# Patient Record
Sex: Female | Born: 1985 | Race: White | Hispanic: No | Marital: Single
Health system: Southern US, Community
[De-identification: ages and names within clinical notes are randomized; demographics above are authoritative.]

## PROBLEM LIST (undated history)

## (undated) DIAGNOSIS — Z87442 Personal history of urinary calculi: Secondary | ICD-10-CM

## (undated) DIAGNOSIS — R51 Headache: Secondary | ICD-10-CM

## (undated) DIAGNOSIS — G43909 Migraine, unspecified, not intractable, without status migrainosus: Secondary | ICD-10-CM

## (undated) DIAGNOSIS — Z9289 Personal history of other medical treatment: Secondary | ICD-10-CM

## (undated) DIAGNOSIS — R519 Headache, unspecified: Secondary | ICD-10-CM

## (undated) DIAGNOSIS — T7840XA Allergy, unspecified, initial encounter: Secondary | ICD-10-CM

## (undated) HISTORY — PX: FRACTURE SURGERY: SHX138

## (undated) HISTORY — PX: TYMPANOSTOMY TUBE PLACEMENT: SHX32

## (undated) HISTORY — DX: Allergy, unspecified, initial encounter: T78.40XA

---

## 2018-03-20 ENCOUNTER — Telehealth: Payer: Self-pay | Admitting: Family Medicine

## 2018-03-20 NOTE — Telephone Encounter (Signed)
Called pt to try and reschedule her appt with Dr. Clelia CroftShaw on 03/29/18. Dr. Clelia CroftShaw is on leave for September and October. When pt calls back, she may make an appt with another provider or make an appt with Dr. Clelia CroftShaw in November.  Thank you!

## 2018-03-25 ENCOUNTER — Telehealth: Payer: Self-pay | Admitting: Family Medicine

## 2018-03-25 NOTE — Telephone Encounter (Signed)
Pt. Called requesting script for spironolactone 50mg  tablets 3 times daily and estradiol 2mg  tablets 90mg  1 by mouth 3 times daily. Pt. Is transgender and had, had an appt to see Dr. Clelia Croft in October that was canceled. Pt. Has been advised that as they have not been seen before a refill was unlikely.   If filled please send to [facility] on [address]

## 2018-03-29 ENCOUNTER — Encounter

## 2018-03-29 ENCOUNTER — Ambulatory Visit: Payer: Self-pay | Admitting: Family Medicine

## 2018-04-17 ENCOUNTER — Ambulatory Visit (INDEPENDENT_AMBULATORY_CARE_PROVIDER_SITE_OTHER): Payer: Self-pay | Admitting: Family Medicine

## 2018-04-17 ENCOUNTER — Encounter: Payer: Self-pay | Admitting: Family Medicine

## 2018-04-17 ENCOUNTER — Other Ambulatory Visit: Payer: Self-pay

## 2018-04-17 VITALS — BP 128/82 | HR 95 | Temp 97.7°F | Ht 64.0 in | Wt 222.4 lb

## 2018-04-17 DIAGNOSIS — F640 Transsexualism: Secondary | ICD-10-CM

## 2018-04-17 DIAGNOSIS — Z789 Other specified health status: Secondary | ICD-10-CM

## 2018-04-17 DIAGNOSIS — F902 Attention-deficit hyperactivity disorder, combined type: Secondary | ICD-10-CM

## 2018-04-17 DIAGNOSIS — Z79899 Other long term (current) drug therapy: Secondary | ICD-10-CM

## 2018-04-17 MED ORDER — AMPHETAMINE-DEXTROAMPHET ER 25 MG PO CP24: 25.0000 mg | ORAL_CAPSULE | ORAL | 0 refills | Status: DC

## 2018-04-17 MED ORDER — ESTRADIOL 2 MG PO TABS: 2.0000 mg | ORAL_TABLET | Freq: Three times a day (TID) | ORAL | 2 refills | Status: AC

## 2018-04-17 MED ORDER — SPIRONOLACTONE 100 MG PO TABS: 100.0000 mg | ORAL_TABLET | Freq: Three times a day (TID) | ORAL | 2 refills | Status: AC

## 2018-04-17 NOTE — Progress Notes (Signed)
10/21/20191:38 PM  Amy Hampton 1986/05/16, 32 y.o. female [id number]  Chief Complaint  Patient presents with  . Medication Management    refills on adderall 25mg , estradial and spirolactone    HPI:   Patient is a 32 y.o. female with past medical history significant for ADHD and M> F who presents today to establish care  Previous patient of Dr Ruby Cola, last appt in April 2019 Started hormone therapy since 2013 Has had some minor interruptions during that time due to finances Last adjustment was to spironolactone, decreased from TTD 300mg  to 150mg , unclear why, she denies any side effects at higher dose she has noticed increase facial hair She cant afford laser hair removal at this time Estradiol has been stable on TTD 6mg  She is seeing a Systems analyst for exercise Goes about 2-3 x week, would like to increase to 4 x week Going back to school, sociology with minor in criminal behavior No counseling In a relationship with transgender M>F Does not smoke Rare alcohol No other drug use  She has been on current meds since 8th grade Dr Ruby Cola was also managing ADHD Notices significant difference when not on medication such as following meetings, power point presentations., completing tasks Has been without adderral for several months pmp reviewed  ASRS evaluation: Part A 1. How often do you have trouble wrapping up the final details of a project, once the challenging parts have been done?: (!) Very Often 2. How often do you have difficulty getting things done in order when you have to do a task that requires organization?: (!) Very Often 3. How often do you have problems remembering appointments or obligations?: (!) Very Often 4. When you have a task that requires a lot of thought, how often do you avoid or delay getting started?: (!) Very Often 5. How often do you fidget or squirm with your hands or feet when you have to sit down for a long time?: (!) Often 6. How often  do you feel overly active and compelled to do things, like you were driven by a motor?: (!) Often Part B 7. How often do you make careless mistakes when you have to work on a boring or difficult project?: (!) Very Often 8. How often do you have difficulty keeping your attention when you are doing boring or repetitive work?: Sometimes 9. How often do you have difficulty concentrating on what people say to you, even when they are speaking to you directly?: (!) Often 10. How often do you misplace or have difficulty finding things at home or at work?: (!) Very Often 11. How often are you distracted by activity or noise around you?: (!) Very Often 12. How often do you leave your seat in meetings or other situations in which you are expected to remain seated?: (!) Very Often 13. How often do you feel restless or fidgety?: (!) Often 14. How often do you have difficulty unwinding and relaxing when you have time to yourself?: (!) Often 15. How often do you find yourself talking too much when you are in social situations?: (!) Often 16. When you are in a conversation, how often do you find yourself finishing the sentences of the people you are talking to, before they can finish them themselves?: (!) Often 17. How often do you have difficulty waiting your turn in situations when turn taking is required?: (!) Often 18. How often do you interrupt others when they are busy?: (!) Often   Fall Risk  04/17/2018  Falls in the past year? No     Depression screen PHQ 2/9 04/17/2018  Decreased Interest 0  Down, Depressed, Hopeless 0  PHQ - 2 Score 0    Allergies  Allergen Reactions  . Zocor [Simvastatin]     Prior to Admission medications   Medication Sig Start Date End Date Taking? Authorizing Provider  amphetamine-dextroamphetamine (ADDERALL XR) 25 MG 24 hr capsule Take 25 mg by mouth every morning.   Yes [provider]  estradiol (ESTRACE) 2 MG tablet Take 2 mg by mouth 3 (three) times  daily.   Yes [provider]  spironolactone (ALDACTONE) 50 MG tablet Take 50 mg by mouth 3 (three) times daily.   Yes [provider]    No past medical history on file.  History reviewed. No pertinent surgical history.  Social History   Tobacco Use  . Smoking status: Never Smoker  . Smokeless tobacco: Never Used  Substance Use Topics  . Alcohol use: Yes    Family History  Adopted: Yes    ROS Per hpi  OBJECTIVE:  Blood pressure 128/82, pulse 95, temperature 97.7 F (36.5 C), temperature source Oral, height 5\' 4"  (1.626 m), weight 222 lb 6.4 oz (100.9 kg), SpO2 94 %. Body mass index is 38.17 kg/m.   Physical Exam  Constitutional: She is oriented to person, place, and time. She appears well-developed and well-nourished.  HENT:  Head: Normocephalic and atraumatic.  Mouth/Throat: Mucous membranes are normal.  Eyes: Pupils are equal, round, and reactive to light. Conjunctivae and EOM are normal. No scleral icterus.  Neck: Neck supple.  Pulmonary/Chest: Effort normal.  Neurological: She is alert and oriented to person, place, and time.  Skin: Skin is warm and dry.  Psychiatric: She has a normal mood and affect.  Nursing note and vitals reviewed.   ASSESSMENT and PLAN  1. Female-to-female transgender person Establishing care today. Reviewed wpath r/se/b of feminizing hormone therapy. Signed documents scanned into her chart. Having increase facial hair, she would like to see if increased dose of spironolactone will help. Discussed slow titration. Discussed r/se/b. Repeat labs and BP in about 3 weeks  2. Medication management - Testosterone, Free, Total, SHBG - Estradiol - CBC - Comprehensive metabolic panel - Basic metabolic panel; Future  3. Attention deficit hyperactivity disorder (ADHD), combined type Uncontrolled in setting of being off meds. pmp reviewed. Medication refilled. She is to call in for refills.  Other orders -  amphetamine-dextroamphetamine (ADDERALL XR) 25 MG 24 hr capsule; Take 1 capsule by mouth every morning. - estradiol (ESTRACE) 2 MG tablet; Take 1 tablet (2 mg total) by mouth 3 (three) times daily. - spironolactone (ALDACTONE) 100 MG tablet; Take 1 tablet (100 mg total) by mouth 3 (three) times daily.  Return in about 3 months (around 07/18/2018) for Dr Clelia Croft.    Myles Lipps, MD Primary Care at [facility] [address] [place], [place] [address] Ph.  [phone] Fax [phone]

## 2018-04-17 NOTE — Patient Instructions (Addendum)
Slowly increase spironolactone by 50mg  a day every week  Once at target dose of 300mg  daily, come in for labs and BP check   If you have lab work done today you will be contacted with your lab results within the next 2 weeks.  If you have not heard from [place] then please contact us. The fastest way to get your results is to register for My Chart.   IF you received an x-ray today, you will receive an invoice from [facility]. Please contact [facility] at [phone] with questions or concerns regarding your invoice.   IF you received labwork today, you will receive an invoice from [facility]. Please contact [facility] at [phone] with questions or concerns regarding your invoice.   Our billing staff will not be able to assist you with questions regarding bills from these companies.  You will be contacted with the lab results as soon as they are available. The fastest way to get your results is to activate your My Chart account. Instructions are located on the last page of this paperwork. If you have not heard from [place] regarding the results in 2 weeks, please contact this office.

## 2018-04-18 LAB — TESTOSTERONE, FREE, TOTAL, SHBG
Sex Hormone Binding: 30 nmol/L (ref 24.6–122.0)
Testosterone, Free: 18.5 pg/mL — ABNORMAL HIGH (ref 0.0–4.2)
Testosterone: 574 ng/dL — ABNORMAL HIGH (ref 8–48)

## 2018-04-18 LAB — CBC
Hematocrit: 44.8 % (ref 34.0–46.6)
Hemoglobin: 15.8 g/dL (ref 11.1–15.9)
MCH: 30.9 pg (ref 26.6–33.0)
MCHC: 35.3 g/dL (ref 31.5–35.7)
MCV: 88 fL (ref 79–97)
Platelets: 318 10*3/uL (ref 150–450)
RBC: 5.12 x10E6/uL (ref 3.77–5.28)
RDW: 12.8 % (ref 12.3–15.4)
WBC: 7.4 10*3/uL (ref 3.4–10.8)

## 2018-04-18 LAB — COMPREHENSIVE METABOLIC PANEL
ALT: 77 IU/L — ABNORMAL HIGH (ref 0–32)
AST: 35 IU/L (ref 0–40)
Albumin/Globulin Ratio: 1.9 (ref 1.2–2.2)
Albumin: 5 g/dL (ref 3.5–5.5)
Alkaline Phosphatase: 75 IU/L (ref 39–117)
BUN/Creatinine Ratio: 16 (ref 9–23)
BUN: 13 mg/dL (ref 6–20)
Bilirubin Total: 0.4 mg/dL (ref 0.0–1.2)
CO2: 19 mmol/L — ABNORMAL LOW (ref 20–29)
Calcium: 9.4 mg/dL (ref 8.7–10.2)
Chloride: 103 mmol/L (ref 96–106)
Creatinine, Ser: 0.82 mg/dL (ref 0.57–1.00)
GFR calc Af Amer: 112 mL/min/{1.73_m2} (ref >59)
GFR calc non Af Amer: 97 mL/min/{1.73_m2} (ref >59)
Globulin, Total: 2.7 g/dL (ref 1.5–4.5)
Glucose: 81 mg/dL (ref 65–99)
Potassium: 4.3 mmol/L (ref 3.5–5.2)
Sodium: 144 mmol/L (ref 134–144)
Total Protein: 7.7 g/dL (ref 6.0–8.5)

## 2018-04-18 LAB — ESTRADIOL: Estradiol: 58.2 pg/mL

## 2018-04-21 ENCOUNTER — Encounter: Payer: Self-pay | Admitting: *Deleted

## 2018-04-21 ENCOUNTER — Telehealth: Payer: Self-pay

## 2018-04-21 NOTE — Telephone Encounter (Signed)
Pt c/b re lab results.  Reviewed provider's feedback.  Will recheck at follow up appt.  Pt verbalizes understanding and denies any questions at this time.

## 2018-04-28 DIAGNOSIS — Z9289 Personal history of other medical treatment: Secondary | ICD-10-CM

## 2018-04-28 HISTORY — DX: Personal history of other medical treatment: Z92.89

## 2018-05-12 ENCOUNTER — Telehealth: Payer: Self-pay | Admitting: Family Medicine

## 2018-05-12 NOTE — Telephone Encounter (Signed)
Copied from CRM [phone]#[id number]. Topic: Quick Communication - Rx Refill/Question >> May 12, 2018 12:10 PM Jens SomMedley, Jennifer A wrote: Medication: amphetamine-dextroamphetamine (ADDERALL XR) 25 MG 24 hr capsule [[id number]][[id number]]  Has the patient contacted their pharmacy? Yes  (Agent: If no, request that the patient contact the pharmacy for the refill.) (Agent: If yes, when and what did the pharmacy advise?)  Preferred Pharmacy (with phone number or street name):[id number] Ginette Otto- [place], [place] - [address] AT [facility] [address] [facility] [address] [place] [id number] Phone: [phone] Fax: [phone]    Agent: Please be advised that RX refills may take up to 3 business days. We ask that you follow-up with your pharmacy.

## 2018-05-17 ENCOUNTER — Emergency Department (HOSPITAL_COMMUNITY): Payer: Self-pay

## 2018-05-17 ENCOUNTER — Inpatient Hospital Stay (HOSPITAL_COMMUNITY): Payer: Self-pay

## 2018-05-17 ENCOUNTER — Encounter (HOSPITAL_COMMUNITY): Admission: EM | Disposition: A | Discharge status: Skilled Nursing Facility | Payer: Self-pay | Source: Home / Self Care

## 2018-05-17 ENCOUNTER — Encounter (HOSPITAL_COMMUNITY): Payer: Self-pay | Admitting: Orthopedic Surgery

## 2018-05-17 ENCOUNTER — Emergency Department (HOSPITAL_COMMUNITY): Payer: Self-pay | Admitting: Anesthesiology

## 2018-05-17 ENCOUNTER — Inpatient Hospital Stay (HOSPITAL_COMMUNITY)
Admission: EM | Admit: 2018-05-17 | Discharge: 2018-06-05 | DRG: 516 | Disposition: A | Discharge status: Skilled Nursing Facility | Payer: Self-pay | Attending: Student | Admitting: Student

## 2018-05-17 DIAGNOSIS — E8889 Other specified metabolic disorders: Secondary | ICD-10-CM | POA: Diagnosis present

## 2018-05-17 DIAGNOSIS — S32810B Multiple fractures of pelvis with stable disruption of pelvic ring, initial encounter for open fracture: Secondary | ICD-10-CM

## 2018-05-17 DIAGNOSIS — D62 Acute posthemorrhagic anemia: Secondary | ICD-10-CM

## 2018-05-17 DIAGNOSIS — E559 Vitamin D deficiency, unspecified: Secondary | ICD-10-CM | POA: Diagnosis present

## 2018-05-17 DIAGNOSIS — S3210XA Unspecified fracture of sacrum, initial encounter for closed fracture: Secondary | ICD-10-CM | POA: Diagnosis present

## 2018-05-17 DIAGNOSIS — R52 Pain, unspecified: Secondary | ICD-10-CM

## 2018-05-17 DIAGNOSIS — N9489 Other specified conditions associated with female genital organs and menstrual cycle: Secondary | ICD-10-CM | POA: Diagnosis present

## 2018-05-17 DIAGNOSIS — G8918 Other acute postprocedural pain: Secondary | ICD-10-CM

## 2018-05-17 DIAGNOSIS — Z789 Other specified health status: Secondary | ICD-10-CM

## 2018-05-17 DIAGNOSIS — S52615A Nondisplaced fracture of left ulna styloid process, initial encounter for closed fracture: Secondary | ICD-10-CM | POA: Diagnosis present

## 2018-05-17 DIAGNOSIS — Z888 Allergy status to other drugs, medicaments and biological substances status: Secondary | ICD-10-CM

## 2018-05-17 DIAGNOSIS — T1490XA Injury, unspecified, initial encounter: Secondary | ICD-10-CM

## 2018-05-17 DIAGNOSIS — F649 Gender identity disorder, unspecified: Secondary | ICD-10-CM | POA: Diagnosis present

## 2018-05-17 DIAGNOSIS — S52612A Displaced fracture of left ulna styloid process, initial encounter for closed fracture: Secondary | ICD-10-CM

## 2018-05-17 DIAGNOSIS — Z419 Encounter for procedure for purposes other than remedying health state, unspecified: Secondary | ICD-10-CM

## 2018-05-17 DIAGNOSIS — M25539 Pain in unspecified wrist: Secondary | ICD-10-CM

## 2018-05-17 DIAGNOSIS — R0682 Tachypnea, not elsewhere classified: Secondary | ICD-10-CM

## 2018-05-17 DIAGNOSIS — S52512A Displaced fracture of left radial styloid process, initial encounter for closed fracture: Secondary | ICD-10-CM | POA: Diagnosis present

## 2018-05-17 DIAGNOSIS — S32810A Multiple fractures of pelvis with stable disruption of pelvic ring, initial encounter for closed fracture: Principal | ICD-10-CM

## 2018-05-17 DIAGNOSIS — R000 Tachycardia, unspecified: Secondary | ICD-10-CM

## 2018-05-17 DIAGNOSIS — F640 Transsexualism: Secondary | ICD-10-CM

## 2018-05-17 DIAGNOSIS — E876 Hypokalemia: Secondary | ICD-10-CM

## 2018-05-17 DIAGNOSIS — S52611A Displaced fracture of right ulna styloid process, initial encounter for closed fracture: Secondary | ICD-10-CM | POA: Diagnosis present

## 2018-05-17 DIAGNOSIS — S329XXA Fracture of unspecified parts of lumbosacral spine and pelvis, initial encounter for closed fracture: Secondary | ICD-10-CM | POA: Diagnosis present

## 2018-05-17 HISTORY — DX: Personal history of other medical treatment: Z92.89

## 2018-05-17 HISTORY — DX: Headache: R51

## 2018-05-17 HISTORY — DX: Rider (driver) (passenger) of other motorcycle injured in unspecified traffic accident, initial encounter: V29.99XA

## 2018-05-17 HISTORY — PX: EXTERNAL FIXATION PELVIS: SHX1551

## 2018-05-17 HISTORY — DX: Migraine, unspecified, not intractable, without status migrainosus: G43.909

## 2018-05-17 HISTORY — DX: Personal history of urinary calculi: Z87.442

## 2018-05-17 HISTORY — PX: ORIF PELVIC FRACTURE: SHX2128

## 2018-05-17 HISTORY — DX: Headache, unspecified: R51.9

## 2018-05-17 LAB — CBC WITH DIFFERENTIAL/PLATELET
Abs Immature Granulocytes: 0.14 10*3/uL — ABNORMAL HIGH (ref 0.00–0.07)
Basophils Absolute: 0.1 10*3/uL (ref 0.0–0.1)
Basophils Relative: 0 %
EOS PCT: 1 %
Eosinophils Absolute: 0.1 10*3/uL (ref 0.0–0.5)
HEMATOCRIT: 40.6 % (ref 36.0–46.0)
HEMOGLOBIN: 13.8 g/dL (ref 12.0–15.0)
Immature Granulocytes: 1 %
LYMPHS ABS: 3.5 10*3/uL (ref 0.7–4.0)
LYMPHS PCT: 23 %
MCH: 30.2 pg (ref 26.0–34.0)
MCHC: 34 g/dL (ref 30.0–36.0)
MCV: 88.8 fL (ref 80.0–100.0)
MONO ABS: 1.1 10*3/uL — AB (ref 0.1–1.0)
Monocytes Relative: 7 %
Neutro Abs: 10.5 10*3/uL — ABNORMAL HIGH (ref 1.7–7.7)
Neutrophils Relative %: 68 %
Platelets: 449 10*3/uL — ABNORMAL HIGH (ref 150–400)
RBC: 4.57 MIL/uL (ref 3.87–5.11)
RDW: 11.7 % (ref 11.5–15.5)
WBC: 15.5 10*3/uL — ABNORMAL HIGH (ref 4.0–10.5)
nRBC: 0 % (ref 0.0–0.2)

## 2018-05-17 LAB — COMPREHENSIVE METABOLIC PANEL
ALBUMIN: 3.8 g/dL (ref 3.5–5.0)
ALK PHOS: 52 U/L (ref 38–126)
ALT: 25 U/L (ref 0–44)
AST: 22 U/L (ref 15–41)
Anion gap: 8 (ref 5–15)
BUN: 19 mg/dL (ref 6–20)
CALCIUM: 8.8 mg/dL — AB (ref 8.9–10.3)
CO2: 22 mmol/L (ref 22–32)
CREATININE: 1.02 mg/dL — AB (ref 0.44–1.00)
Chloride: 109 mmol/L (ref 98–111)
GFR calc Af Amer: >60 mL/min (ref >60)
GFR calc non Af Amer: >60 mL/min (ref >60)
GLUCOSE: 169 mg/dL — AB (ref 70–99)
Potassium: 3.8 mmol/L (ref 3.5–5.1)
SODIUM: 139 mmol/L (ref 135–145)
Total Bilirubin: 0.7 mg/dL (ref 0.3–1.2)
Total Protein: 6.9 g/dL (ref 6.5–8.1)

## 2018-05-17 LAB — POCT I-STAT 4, (NA,K, GLUC, HGB,HCT)
Glucose, Bld: 151 mg/dL — ABNORMAL HIGH (ref 70–99)
HCT: 39 % (ref 36.0–46.0)
HEMOGLOBIN: 13.3 g/dL (ref 12.0–15.0)
POTASSIUM: 4.3 mmol/L (ref 3.5–5.1)
Sodium: 141 mmol/L (ref 135–145)

## 2018-05-17 LAB — I-STAT CHEM 8, ED
BUN: 21 mg/dL — ABNORMAL HIGH (ref 6–20)
Calcium, Ion: 1.11 mmol/L — ABNORMAL LOW (ref 1.15–1.40)
Chloride: 106 mmol/L (ref 98–111)
Creatinine, Ser: 0.9 mg/dL (ref 0.44–1.00)
GLUCOSE: 167 mg/dL — AB (ref 70–99)
HCT: 39 % (ref 36.0–46.0)
HEMOGLOBIN: 13.3 g/dL (ref 12.0–15.0)
POTASSIUM: 3.8 mmol/L (ref 3.5–5.1)
Sodium: 139 mmol/L (ref 135–145)
TCO2: 25 mmol/L (ref 22–32)

## 2018-05-17 LAB — CDS SEROLOGY

## 2018-05-17 LAB — I-STAT CG4 LACTIC ACID, ED: LACTIC ACID, VENOUS: 2.71 mmol/L — AB (ref 0.5–1.9)

## 2018-05-17 LAB — ETHANOL: Alcohol, Ethyl (B): <10 mg/dL (ref <10)

## 2018-05-17 LAB — MRSA PCR SCREENING: MRSA by PCR: NEGATIVE

## 2018-05-17 LAB — ABO/RH: ABO/RH(D): A NEG

## 2018-05-17 LAB — PREPARE RBC (CROSSMATCH)

## 2018-05-17 SURGERY — EXTERNAL FIXATION, PELVIS
Anesthesia: General

## 2018-05-17 MED ORDER — SUCCINYLCHOLINE CHLORIDE 200 MG/10ML IV SOSY
PREFILLED_SYRINGE | INTRAVENOUS | Status: DC | PRN
  Administered 2018-05-17: 120 mg via INTRAVENOUS

## 2018-05-17 MED ORDER — HYDROMORPHONE HCL 1 MG/ML IJ SOLN
0.5000 mg | Freq: Once | INTRAMUSCULAR | Status: AC
  Administered 2018-05-17: 0.5 mg via INTRAVENOUS
  Filled 2018-05-17: qty 0.5

## 2018-05-17 MED ORDER — SODIUM CHLORIDE 0.9% IV SOLUTION: Freq: Once | INTRAVENOUS | Status: DC

## 2018-05-17 MED ORDER — PANTOPRAZOLE SODIUM 40 MG PO TBEC
40.0000 mg | DELAYED_RELEASE_TABLET | Freq: Every day | ORAL | Status: DC
  Administered 2018-05-18 – 2018-06-05 (×18): 40 mg via ORAL
  Filled 2018-05-17 (×18): qty 1

## 2018-05-17 MED ORDER — ALBUMIN HUMAN 5 % IV SOLN
25.0000 g | Freq: Once | INTRAVENOUS | Status: AC
  Administered 2018-05-17: 25 g via INTRAVENOUS
  Filled 2018-05-17 (×2): qty 500

## 2018-05-17 MED ORDER — LACTATED RINGERS IV SOLN
INTRAVENOUS | Status: DC | PRN
  Administered 2018-05-17 (×2): via INTRAVENOUS

## 2018-05-17 MED ORDER — DIPHENHYDRAMINE HCL 12.5 MG/5ML PO ELIX: 12.5000 mg | ORAL_SOLUTION | Freq: Four times a day (QID) | ORAL | Status: DC | PRN

## 2018-05-17 MED ORDER — KETOROLAC TROMETHAMINE 15 MG/ML IJ SOLN
15.0000 mg | Freq: Four times a day (QID) | INTRAMUSCULAR | Status: AC
  Administered 2018-05-17 – 2018-05-18 (×4): 15 mg via INTRAVENOUS
  Filled 2018-05-17 (×5): qty 1

## 2018-05-17 MED ORDER — PANTOPRAZOLE SODIUM 40 MG IV SOLR: 40.0000 mg | Freq: Every day | INTRAVENOUS | Status: DC

## 2018-05-17 MED ORDER — HYDROMORPHONE HCL 1 MG/ML IJ SOLN
0.2500 mg | INTRAMUSCULAR | Status: DC | PRN
  Administered 2018-05-17 (×4): 0.5 mg via INTRAVENOUS

## 2018-05-17 MED ORDER — LIDOCAINE 2% (20 MG/ML) 5 ML SYRINGE
INTRAMUSCULAR | Status: AC
  Filled 2018-05-17: qty 5

## 2018-05-17 MED ORDER — DEXAMETHASONE SODIUM PHOSPHATE 10 MG/ML IJ SOLN
INTRAMUSCULAR | Status: DC | PRN
  Administered 2018-05-17: 10 mg via INTRAVENOUS

## 2018-05-17 MED ORDER — MIDAZOLAM HCL 2 MG/2ML IJ SOLN
INTRAMUSCULAR | Status: DC | PRN
  Administered 2018-05-17: 2 mg via INTRAVENOUS

## 2018-05-17 MED ORDER — HYDROMORPHONE HCL 1 MG/ML IJ SOLN: 0.5000 mg | INTRAMUSCULAR | Status: DC | PRN

## 2018-05-17 MED ORDER — GABAPENTIN 100 MG PO CAPS
100.0000 mg | ORAL_CAPSULE | Freq: Three times a day (TID) | ORAL | Status: DC
  Administered 2018-05-17 – 2018-05-24 (×19): 100 mg via ORAL
  Filled 2018-05-17 (×19): qty 1

## 2018-05-17 MED ORDER — FENTANYL CITRATE (PF) 100 MCG/2ML IJ SOLN
INTRAMUSCULAR | Status: DC | PRN
  Administered 2018-05-17 (×2): 50 ug via INTRAVENOUS

## 2018-05-17 MED ORDER — NALOXONE HCL 0.4 MG/ML IJ SOLN: 0.4000 mg | INTRAMUSCULAR | Status: DC | PRN

## 2018-05-17 MED ORDER — 0.9 % SODIUM CHLORIDE (POUR BTL) OPTIME
TOPICAL | Status: DC | PRN
  Administered 2018-05-17: 1000 mL

## 2018-05-17 MED ORDER — ROCURONIUM BROMIDE 50 MG/5ML IV SOSY
PREFILLED_SYRINGE | INTRAVENOUS | Status: AC
  Filled 2018-05-17: qty 5

## 2018-05-17 MED ORDER — PHENYLEPHRINE 40 MCG/ML (10ML) SYRINGE FOR IV PUSH (FOR BLOOD PRESSURE SUPPORT)
PREFILLED_SYRINGE | INTRAVENOUS | Status: AC
  Filled 2018-05-17: qty 10

## 2018-05-17 MED ORDER — POVIDONE-IODINE 10 % EX SWAB: 2.0000 "application " | Freq: Once | CUTANEOUS | Status: DC

## 2018-05-17 MED ORDER — MIDAZOLAM HCL 2 MG/2ML IJ SOLN
2.0000 mg | Freq: Once | INTRAMUSCULAR | Status: AC
  Administered 2018-05-17: 2 mg via INTRAVENOUS

## 2018-05-17 MED ORDER — PHENYLEPHRINE 40 MCG/ML (10ML) SYRINGE FOR IV PUSH (FOR BLOOD PRESSURE SUPPORT)
PREFILLED_SYRINGE | INTRAVENOUS | Status: DC | PRN
  Administered 2018-05-17 (×4): 80 ug via INTRAVENOUS

## 2018-05-17 MED ORDER — FENTANYL CITRATE (PF) 250 MCG/5ML IJ SOLN
INTRAMUSCULAR | Status: AC
  Filled 2018-05-17: qty 5

## 2018-05-17 MED ORDER — ACETAMINOPHEN 500 MG PO TABS
1000.0000 mg | ORAL_TABLET | Freq: Three times a day (TID) | ORAL | Status: DC
  Administered 2018-05-17 – 2018-05-20 (×8): 1000 mg via ORAL
  Filled 2018-05-17 (×8): qty 2

## 2018-05-17 MED ORDER — ONDANSETRON HCL 4 MG/2ML IJ SOLN
4.0000 mg | Freq: Once | INTRAMUSCULAR | Status: AC
  Administered 2018-05-17: 4 mg via INTRAVENOUS
  Filled 2018-05-17: qty 2

## 2018-05-17 MED ORDER — CLINDAMYCIN PHOSPHATE 900 MG/50ML IV SOLN
INTRAVENOUS | Status: AC
  Filled 2018-05-17: qty 50

## 2018-05-17 MED ORDER — MIDAZOLAM HCL 2 MG/2ML IJ SOLN
INTRAMUSCULAR | Status: AC
  Administered 2018-05-17: 2 mg via INTRAVENOUS
  Filled 2018-05-17: qty 2

## 2018-05-17 MED ORDER — OXYCODONE HCL 5 MG PO TABS
5.0000 mg | ORAL_TABLET | ORAL | Status: DC | PRN
  Administered 2018-05-17 – 2018-05-21 (×8): 10 mg via ORAL
  Filled 2018-05-17 (×10): qty 2

## 2018-05-17 MED ORDER — HYDROMORPHONE HCL 1 MG/ML IJ SOLN
INTRAMUSCULAR | Status: AC
  Administered 2018-05-17: 0.5 mg via INTRAVENOUS
  Filled 2018-05-17: qty 2

## 2018-05-17 MED ORDER — IOHEXOL 300 MG/ML  SOLN
100.0000 mL | Freq: Once | INTRAMUSCULAR | Status: AC | PRN
  Administered 2018-05-17: 100 mL via INTRAVENOUS

## 2018-05-17 MED ORDER — FENTANYL CITRATE (PF) 100 MCG/2ML IJ SOLN
INTRAMUSCULAR | Status: AC
  Administered 2018-05-17: 50 ug via INTRAVENOUS
  Filled 2018-05-17: qty 2

## 2018-05-17 MED ORDER — FENTANYL CITRATE (PF) 100 MCG/2ML IJ SOLN
INTRAMUSCULAR | Status: AC
  Filled 2018-05-17: qty 2

## 2018-05-17 MED ORDER — HYDROMORPHONE HCL 1 MG/ML IJ SOLN: 0.5000 mg | Freq: Once | INTRAMUSCULAR | Status: DC

## 2018-05-17 MED ORDER — MIDAZOLAM HCL 2 MG/2ML IJ SOLN
INTRAMUSCULAR | Status: AC
  Filled 2018-05-17: qty 2

## 2018-05-17 MED ORDER — ENOXAPARIN SODIUM 40 MG/0.4ML ~~LOC~~ SOLN
40.0000 mg | SUBCUTANEOUS | Status: DC
  Administered 2018-05-17 – 2018-06-04 (×19): 40 mg via SUBCUTANEOUS
  Filled 2018-05-17 (×19): qty 0.4

## 2018-05-17 MED ORDER — CLINDAMYCIN PHOSPHATE 900 MG/50ML IV SOLN
900.0000 mg | INTRAVENOUS | Status: AC
  Administered 2018-05-17: 900 mg via INTRAVENOUS
  Filled 2018-05-17: qty 50

## 2018-05-17 MED ORDER — PROPOFOL 10 MG/ML IV BOLUS
INTRAVENOUS | Status: DC | PRN
  Administered 2018-05-17: 100 mg via INTRAVENOUS

## 2018-05-17 MED ORDER — LIDOCAINE 2% (20 MG/ML) 5 ML SYRINGE
INTRAMUSCULAR | Status: DC | PRN
  Administered 2018-05-17: 60 mg via INTRAVENOUS

## 2018-05-17 MED ORDER — ROCURONIUM BROMIDE 50 MG/5ML IV SOSY
PREFILLED_SYRINGE | INTRAVENOUS | Status: DC | PRN
  Administered 2018-05-17: 30 mg via INTRAVENOUS
  Administered 2018-05-17: 20 mg via INTRAVENOUS

## 2018-05-17 MED ORDER — HYDROMORPHONE HCL 1 MG/ML IJ SOLN
INTRAMUSCULAR | Status: AC
  Administered 2018-05-17: 0.5 mg via INTRAVENOUS
  Filled 2018-05-17: qty 1

## 2018-05-17 MED ORDER — MORPHINE SULFATE (PF) 4 MG/ML IV SOLN
4.0000 mg | Freq: Once | INTRAVENOUS | Status: AC
  Administered 2018-05-17: 4 mg via INTRAVENOUS
  Filled 2018-05-17: qty 1

## 2018-05-17 MED ORDER — SODIUM CHLORIDE 0.9 % IV SOLN
INTRAVENOUS | Status: DC
  Administered 2018-05-17 – 2018-05-20 (×2): via INTRAVENOUS

## 2018-05-17 MED ORDER — ORAL CARE MOUTH RINSE
15.0000 mL | Freq: Two times a day (BID) | OROMUCOSAL | Status: DC
  Administered 2018-05-18 – 2018-05-31 (×18): 15 mL via OROMUCOSAL

## 2018-05-17 MED ORDER — HYDROMORPHONE HCL 1 MG/ML IJ SOLN
0.5000 mg | Freq: Once | INTRAMUSCULAR | Status: AC
  Administered 2018-05-17: 0.5 mg via INTRAVENOUS

## 2018-05-17 MED ORDER — CHLORHEXIDINE GLUCONATE 4 % EX LIQD
60.0000 mL | Freq: Once | CUTANEOUS | Status: DC
  Filled 2018-05-17: qty 60

## 2018-05-17 MED ORDER — SODIUM CHLORIDE 0.9 % IV SOLN
INTRAVENOUS | Status: DC | PRN
  Administered 2018-05-17: 30 ug/min via INTRAVENOUS

## 2018-05-17 MED ORDER — HYDROMORPHONE HCL 1 MG/ML IJ SOLN: 1.0000 mg | INTRAMUSCULAR | Status: DC | PRN

## 2018-05-17 MED ORDER — SUCCINYLCHOLINE CHLORIDE 200 MG/10ML IV SOSY
PREFILLED_SYRINGE | INTRAVENOUS | Status: AC
  Filled 2018-05-17: qty 10

## 2018-05-17 MED ORDER — DEXAMETHASONE SODIUM PHOSPHATE 10 MG/ML IJ SOLN
INTRAMUSCULAR | Status: AC
  Filled 2018-05-17: qty 1

## 2018-05-17 MED ORDER — DIPHENHYDRAMINE HCL 50 MG/ML IJ SOLN
12.5000 mg | Freq: Four times a day (QID) | INTRAMUSCULAR | Status: DC | PRN
  Administered 2018-05-18 – 2018-05-21 (×2): 12.5 mg via INTRAVENOUS
  Filled 2018-05-17 (×2): qty 1

## 2018-05-17 MED ORDER — ONDANSETRON HCL 4 MG/2ML IJ SOLN
INTRAMUSCULAR | Status: AC
  Filled 2018-05-17: qty 2

## 2018-05-17 MED ORDER — SODIUM CHLORIDE 0.9% FLUSH: 9.0000 mL | INTRAVENOUS | Status: DC | PRN

## 2018-05-17 MED ORDER — FENTANYL CITRATE (PF) 100 MCG/2ML IJ SOLN
50.0000 ug | Freq: Once | INTRAMUSCULAR | Status: AC
  Administered 2018-05-17: 50 ug via INTRAVENOUS

## 2018-05-17 MED ORDER — ONDANSETRON 4 MG PO TBDP
4.0000 mg | ORAL_TABLET | Freq: Four times a day (QID) | ORAL | Status: DC | PRN
  Administered 2018-05-30: 4 mg via ORAL
  Filled 2018-05-17: qty 1

## 2018-05-17 MED ORDER — HYDROMORPHONE 1 MG/ML IV SOLN
INTRAVENOUS | Status: DC
  Administered 2018-05-17: 2.1 mg via INTRAVENOUS
  Administered 2018-05-17: 25 mg via INTRAVENOUS
  Administered 2018-05-18: 2.6 mg via INTRAVENOUS
  Administered 2018-05-18: 3.8 mg via INTRAVENOUS
  Administered 2018-05-18: 2.2 mg via INTRAVENOUS
  Administered 2018-05-18: 1.6 mg via INTRAVENOUS
  Administered 2018-05-19: 25 mg via INTRAVENOUS
  Filled 2018-05-17 (×2): qty 25

## 2018-05-17 MED ORDER — ONDANSETRON HCL 4 MG/2ML IJ SOLN
4.0000 mg | Freq: Four times a day (QID) | INTRAMUSCULAR | Status: DC | PRN
  Filled 2018-05-17 (×2): qty 2

## 2018-05-17 MED ORDER — LACTATED RINGERS IV SOLN
INTRAVENOUS | Status: DC
  Administered 2018-05-17 – 2018-05-21 (×3): via INTRAVENOUS

## 2018-05-17 MED ORDER — SUGAMMADEX SODIUM 200 MG/2ML IV SOLN
INTRAVENOUS | Status: DC | PRN
  Administered 2018-05-17: 200 mg via INTRAVENOUS

## 2018-05-17 MED ORDER — ONDANSETRON HCL 4 MG/2ML IJ SOLN
4.0000 mg | Freq: Four times a day (QID) | INTRAMUSCULAR | Status: DC | PRN
  Administered 2018-05-23 – 2018-05-25 (×2): 4 mg via INTRAVENOUS

## 2018-05-17 MED ORDER — ONDANSETRON HCL 4 MG/2ML IJ SOLN
INTRAMUSCULAR | Status: DC | PRN
  Administered 2018-05-17: 4 mg via INTRAVENOUS

## 2018-05-17 MED ORDER — SUGAMMADEX SODIUM 500 MG/5ML IV SOLN
INTRAVENOUS | Status: AC
  Filled 2018-05-17: qty 5

## 2018-05-17 SURGICAL SUPPLY — 60 items
BENZOIN TINCTURE PRP APPL 2/3 (GAUZE/BANDAGES/DRESSINGS) ×9 IMPLANT
BLADE CLIPPER SURG (BLADE) IMPLANT
BLADE SURG 11 STRL SS (BLADE) ×3 IMPLANT
BNDG GAUZE ELAST 4 BULKY (GAUZE/BANDAGES/DRESSINGS) ×3 IMPLANT
BRUSH SCRUB SURG 4.25 DISP (MISCELLANEOUS) ×6 IMPLANT
CHLORAPREP W/TINT 26ML (MISCELLANEOUS) ×3 IMPLANT
CLAMP ROD ATTACHMENT (Clamp) ×9 IMPLANT
COVER SURGICAL LIGHT HANDLE (MISCELLANEOUS) ×6 IMPLANT
COVER WAND RF STERILE (DRAPES) ×3 IMPLANT
DERMABOND ADVANCED (GAUZE/BANDAGES/DRESSINGS) ×4
DERMABOND ADVANCED .7 DNX12 (GAUZE/BANDAGES/DRESSINGS) ×2 IMPLANT
DRAIN CHANNEL 15F RND FF W/TCR (WOUND CARE) IMPLANT
DRAPE C-ARM 42X72 X-RAY (DRAPES) ×3 IMPLANT
DRAPE C-ARMOR (DRAPES) ×3 IMPLANT
DRAPE INCISE IOBAN 66X45 STRL (DRAPES) ×6 IMPLANT
DRAPE INCISE IOBAN 85X60 (DRAPES) ×3 IMPLANT
DRAPE PROXIMA HALF (DRAPES) ×6 IMPLANT
DRAPE SURG 17X23 STRL (DRAPES) ×18 IMPLANT
DRAPE U-SHAPE 47X51 STRL (DRAPES) ×3 IMPLANT
DRAPE UNIVERSAL PACK (DRAPES) ×3 IMPLANT
DRSG MEPILEX BORDER 4X4 (GAUZE/BANDAGES/DRESSINGS) ×6 IMPLANT
DRSG MEPILEX BORDER 4X8 (GAUZE/BANDAGES/DRESSINGS) ×6 IMPLANT
ELECT REM PT RETURN 9FT ADLT (ELECTROSURGICAL) ×3
ELECTRODE REM PT RTRN 9FT ADLT (ELECTROSURGICAL) ×1 IMPLANT
EVACUATOR SILICONE 100CC (DRAIN) IMPLANT
GLOVE BIO SURGEON STRL SZ7.5 (GLOVE) ×12 IMPLANT
GLOVE BIOGEL PI IND STRL 7.5 (GLOVE) ×1 IMPLANT
GLOVE BIOGEL PI INDICATOR 7.5 (GLOVE) ×2
GOWN STRL REUS W/ TWL LRG LVL3 (GOWN DISPOSABLE) ×2 IMPLANT
GOWN STRL REUS W/TWL LRG LVL3 (GOWN DISPOSABLE) ×4
KIT BASIN OR (CUSTOM PROCEDURE TRAY) ×3 IMPLANT
KIT TURNOVER KIT B (KITS) ×3 IMPLANT
MANIFOLD NEPTUNE II (INSTRUMENTS) ×3 IMPLANT
NS IRRIG 1000ML POUR BTL (IV SOLUTION) ×6 IMPLANT
PACK GENERAL/GYN (CUSTOM PROCEDURE TRAY) ×3 IMPLANT
PACK TOTAL JOINT (CUSTOM PROCEDURE TRAY) ×3 IMPLANT
PACK UNIVERSAL I (CUSTOM PROCEDURE TRAY) ×3 IMPLANT
PAD ARMBOARD 7.5X6 YLW CONV (MISCELLANEOUS) ×6 IMPLANT
ROD CRBN FBR LRG EX-FX 11X250 (Rod) ×3 IMPLANT
SCREW SCHNZ SD 5.0 80 THRD/200 (Screw) ×2 IMPLANT
SCREW SHANZ 4.0X80MM (EXFIX) ×6 IMPLANT
SCRW SCHANZ SD 5.0 80 THRD/200 (Screw) ×6 IMPLANT
SPONGE LAP 18X18 X RAY DECT (DISPOSABLE) IMPLANT
STAPLER VISISTAT 35W (STAPLE) ×3 IMPLANT
SUCTION FRAZIER HANDLE 10FR (MISCELLANEOUS) ×2
SUCTION TUBE FRAZIER 10FR DISP (MISCELLANEOUS) ×1 IMPLANT
SUT MNCRL AB 3-0 PS2 18 (SUTURE) ×3 IMPLANT
SUT MON AB 2-0 CT1 36 (SUTURE) ×3 IMPLANT
SUT VIC AB 0 CT1 27 (SUTURE) ×4
SUT VIC AB 0 CT1 27XBRD ANBCTR (SUTURE) ×2 IMPLANT
SUT VIC AB 1 CT1 18XCR BRD 8 (SUTURE) IMPLANT
SUT VIC AB 1 CT1 8-18 (SUTURE)
SUT VIC AB 2-0 CT1 27 (SUTURE) ×4
SUT VIC AB 2-0 CT1 TAPERPNT 27 (SUTURE) ×2 IMPLANT
SUT VIC AB 2-0 FS1 27 (SUTURE) ×3 IMPLANT
TOWEL OR 17X24 6PK STRL BLUE (TOWEL DISPOSABLE) ×3 IMPLANT
TOWEL OR 17X26 10 PK STRL BLUE (TOWEL DISPOSABLE) ×6 IMPLANT
TRAY FOLEY MTR SLVR 16FR STAT (SET/KITS/TRAYS/PACK) IMPLANT
UNDERPAD 30X30 (UNDERPADS AND DIAPERS) ×3 IMPLANT
WATER STERILE IRR 1000ML POUR (IV SOLUTION) ×12 IMPLANT

## 2018-05-17 NOTE — Progress Notes (Signed)
Received request for anesthesia for second IV site.  When I touched patients right wrist, she withdrew in pain. Patient stated right wrist pain is ongoing.  Called Dr. Jena Hampton in OR for right wrist xray order.  Xray performed at bedside.
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
## 2018-05-17 NOTE — Transfer of Care (Signed)
Immediate Anesthesia Transfer of Care Note  Patient: Amy Lodgeiffany Hampton  Procedure(s) Performed: EXTERNAL FIXATION PELVIS (N/A ) OPEN REDUCTION INTERNAL FIXATION (ORIF) PELVIC FRACTURE (N/A )  Patient Location: PACU  Anesthesia Type:General  Level of Consciousness: awake and patient cooperative  Airway & Oxygen Therapy: Patient Spontanous Breathing and Patient connected to nasal cannula oxygen  Post-op Assessment: Report given to RN, Post -op Vital signs reviewed and stable and Patient moving all extremities X 4  Post vital signs: Reviewed and stable  Last Vitals:  Vitals Value Taken Time  BP 120/62 05/17/2018  5:04 PM  Temp    Pulse 107 05/17/2018  5:12 PM  Resp 14 05/17/2018  5:12 PM  SpO2 99 % 05/17/2018  5:12 PM  Vitals shown include unvalidated device data.  Last Pain:  Vitals:   05/17/18 0819  TempSrc: Temporal         Complications: No apparent anesthesia complications

## 2018-05-17 NOTE — Anesthesia Preprocedure Evaluation (Addendum)
Anesthesia Evaluation  Patient identified by MRN, date of birth, ID band Patient awake    Reviewed: Allergy & Precautions, NPO status , Patient's Chart, lab work & pertinent test results  History of Anesthesia Complications Negative for: history of anesthetic complications  Airway Mallampati: III  TM Distance: >3 FB Neck ROM: Full    Dental no notable dental hx. (+) Dental Advisory Given   Pulmonary neg pulmonary ROS,    Pulmonary exam normal        Cardiovascular negative cardio ROS Normal cardiovascular exam     Neuro/Psych negative neurological ROS     GI/Hepatic negative GI ROS, Neg liver ROS,   Endo/Other  negative endocrine ROS  Renal/GU negative Renal ROS     Musculoskeletal negative musculoskeletal ROS (+)   Abdominal   Peds  Hematology negative hematology ROS (+)   Anesthesia Other Findings Day of surgery medications reviewed with the patient.  Reproductive/Obstetrics                            Anesthesia Physical Anesthesia Plan  ASA: III  Anesthesia Plan: General   Post-op Pain Management:    Induction: Intravenous  PONV Risk Score and Plan: 3 and Ondansetron, Dexamethasone and Diphenhydramine  Airway Management Planned: Oral ETT  Additional Equipment:   Intra-op Plan:   Post-operative Plan: Extubation in OR  Informed Consent: I have reviewed the patients History and Physical, chart, labs and discussed the procedure including the risks, benefits and alternatives for the proposed anesthesia with the patient or authorized representative who has indicated his/her understanding and acceptance.   Dental advisory given  Plan Discussed with: CRNA and Anesthesiologist  Anesthesia Plan Comments:        Anesthesia Quick Evaluation
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
## 2018-05-17 NOTE — Progress Notes (Signed)
Patient is tachycardic and has some abdominal pain without evidence of intra-abdominal injury on CT.  Does have a significant pelvic hematoma.  C-spine is cleared  Marta LamasJames O. Gae BonWyatt, III, MD, FACS [phone] Trauma Surgeon

## 2018-05-17 NOTE — Progress Notes (Addendum)
Patient's heart rate 140- 160.  Dr. Krista BlueSinger made aware and we will continue to monitor.

## 2018-05-17 NOTE — Progress Notes (Signed)
Orthopedic Tech Progress Note Patient Details:  Amy Hampton 12/14/1985 [id number]  Ortho Devices Type of Ortho Device: Ace wrap, Sling arm elevator Ortho Device/Splint Interventions: Application   Post Interventions Patient Tolerated: Well Instructions Provided: Care of device   Saul FordyceJennifer C Yeni Jiggetts 05/17/2018, 10:47 AM

## 2018-05-17 NOTE — Anesthesia Postprocedure Evaluation (Signed)
Anesthesia Post Note  Patient: Dartha Lodgeiffany Ramakrishnan  Procedure(s) Performed: EXTERNAL FIXATION PELVIS (N/A ) OPEN REDUCTION INTERNAL FIXATION (ORIF) PELVIC FRACTURE (N/A )     Patient location during evaluation: PACU Anesthesia Type: General Level of consciousness: awake and alert Pain management: pain level controlled Vital Signs Assessment: post-procedure vital signs reviewed and stable Respiratory status: spontaneous breathing, nonlabored ventilation, respiratory function stable and patient connected to nasal cannula oxygen Cardiovascular status: blood pressure returned to baseline and stable Postop Assessment: no apparent nausea or vomiting Anesthetic complications: no    Last Vitals:  Vitals:   05/17/18 1705 05/17/18 1735  BP: 120/62 123/71  Pulse: 98 92  Resp: 18 16  Temp: (!) 36.2 C   SpO2: 100% 97%    Last Pain:  Vitals:   05/17/18 1745  TempSrc:   PainSc: 10-Worst pain ever                 Yordin Rhoda,W. EDMOND
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
## 2018-05-17 NOTE — Anesthesia Procedure Notes (Signed)
Central Venous Catheter Insertion Performed by: Heather RobertsSinger, Margo Lama, MD, anesthesiologist Start/End11/20/2019 1:36 PM, 05/17/2018 1:46 PM Patient location: Pre-op. Preanesthetic checklist: patient identified, IV checked, site marked, risks and benefits discussed, surgical consent, monitors and equipment checked, pre-op evaluation, timeout performed and anesthesia consent Position: Trendelenburg Lidocaine 1% used for infiltration and patient sedated Hand hygiene performed , maximum sterile barriers used  and Seldinger technique used Catheter size: 8 Fr Total catheter length 16. Central line was placed.Double lumen Procedure performed using ultrasound guided technique. Ultrasound Notes:image(s) printed for medical record Attempts: 1 Following insertion, dressing applied, line sutured and Biopatch. Post procedure assessment: blood return through all ports, free fluid flow and no air  Patient tolerated the procedure well with no immediate complications.
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
## 2018-05-17 NOTE — ED Notes (Signed)
Immediately after pelvic xray pelvic binder applied. Ems had already wrapped pelvis

## 2018-05-17 NOTE — Anesthesia Procedure Notes (Signed)
Procedure Name: Intubation Date/Time: 05/17/2018 3:29 PM Performed by: Pearson Grippeobertson, Raymie Trani M, CRNA Pre-anesthesia Checklist: Patient identified, Emergency Drugs available, Suction available and Patient being monitored Patient Re-evaluated:Patient Re-evaluated prior to induction Oxygen Delivery Method: Circle system utilized Preoxygenation: Pre-oxygenation with 100% oxygen Induction Type: IV induction Ventilation: Mask ventilation without difficulty and Oral airway inserted - appropriate to patient size Laryngoscope Size: Hyacinth MeekerMiller and 2 Grade View: Grade II Tube type: Oral Tube size: 7.0 mm Number of attempts: 1 Airway Equipment and Method: Stylet and Oral airway Placement Confirmation: ETT inserted through vocal cords under direct vision,  positive ETCO2 and breath sounds checked- equal and bilateral Secured at: 22 cm Tube secured with: Tape Dental Injury: Teeth and Oropharynx as per pre-operative assessment

## 2018-05-18 LAB — BASIC METABOLIC PANEL
Anion gap: 5 (ref 5–15)
BUN: 10 mg/dL (ref 6–20)
CO2: 25 mmol/L (ref 22–32)
CREATININE: 0.78 mg/dL (ref 0.44–1.00)
Calcium: 7.8 mg/dL — ABNORMAL LOW (ref 8.9–10.3)
Chloride: 105 mmol/L (ref 98–111)
GFR calc Af Amer: >60 mL/min (ref >60)
GFR calc non Af Amer: >60 mL/min (ref >60)
Glucose, Bld: 135 mg/dL — ABNORMAL HIGH (ref 70–99)
Potassium: 3.5 mmol/L (ref 3.5–5.1)
SODIUM: 135 mmol/L (ref 135–145)

## 2018-05-18 LAB — CBC
HCT: 26.8 % — ABNORMAL LOW (ref 36.0–46.0)
HCT: 30.5 % — ABNORMAL LOW (ref 36.0–46.0)
Hemoglobin: 9 g/dL — ABNORMAL LOW (ref 12.0–15.0)
Hemoglobin: 9.8 g/dL — ABNORMAL LOW (ref 12.0–15.0)
MCH: 28.8 pg (ref 26.0–34.0)
MCH: 30.5 pg (ref 26.0–34.0)
MCHC: 32.1 g/dL (ref 30.0–36.0)
MCHC: 33.6 g/dL (ref 30.0–36.0)
MCV: 89.7 fL (ref 80.0–100.0)
MCV: 90.8 fL (ref 80.0–100.0)
NRBC: 0 % (ref 0.0–0.2)
PLATELETS: 285 10*3/uL (ref 150–400)
Platelets: 227 10*3/uL (ref 150–400)
RBC: 2.95 MIL/uL — ABNORMAL LOW (ref 3.87–5.11)
RBC: 3.4 MIL/uL — AB (ref 3.87–5.11)
RDW: 11.9 % (ref 11.5–15.5)
RDW: 12 % (ref 11.5–15.5)
WBC: 10.7 10*3/uL — AB (ref 4.0–10.5)
WBC: 9 10*3/uL (ref 4.0–10.5)
nRBC: 0 % (ref 0.0–0.2)

## 2018-05-18 LAB — HIV ANTIBODY (ROUTINE TESTING W REFLEX): HIV SCREEN 4TH GENERATION: NONREACTIVE

## 2018-05-18 LAB — PREPARE RBC (CROSSMATCH)

## 2018-05-18 MED ORDER — CHLORHEXIDINE GLUCONATE CLOTH 2 % EX PADS
6.0000 | MEDICATED_PAD | Freq: Every day | CUTANEOUS | Status: DC
  Administered 2018-05-19 – 2018-05-23 (×3): 6 via TOPICAL

## 2018-05-18 MED ORDER — ESTRADIOL 2 MG PO TABS
4.0000 mg | ORAL_TABLET | Freq: Every morning | ORAL | Status: DC
  Administered 2018-05-20 – 2018-06-05 (×18): 4 mg via ORAL
  Filled 2018-05-18 (×20): qty 2

## 2018-05-18 MED ORDER — SODIUM CHLORIDE 0.9% FLUSH: 10.0000 mL | INTRAVENOUS | Status: DC | PRN

## 2018-05-18 MED ORDER — AMPHETAMINE-DEXTROAMPHET ER 5 MG PO CP24
25.0000 mg | ORAL_CAPSULE | Freq: Every day | ORAL | Status: DC
  Administered 2018-05-20 – 2018-06-05 (×17): 25 mg via ORAL
  Filled 2018-05-18 (×17): qty 1

## 2018-05-18 MED ORDER — ESTRADIOL 2 MG PO TABS
2.0000 mg | ORAL_TABLET | Freq: Every day | ORAL | Status: DC
  Administered 2018-05-19 – 2018-06-04 (×17): 2 mg via ORAL
  Filled 2018-05-18 (×19): qty 1

## 2018-05-18 MED ORDER — SPIRONOLACTONE 25 MG PO TABS
100.0000 mg | ORAL_TABLET | Freq: Three times a day (TID) | ORAL | Status: DC
  Administered 2018-05-18 – 2018-05-19 (×3): 100 mg via ORAL
  Filled 2018-05-18 (×3): qty 4

## 2018-05-18 MED ORDER — ESTRADIOL 2 MG PO TABS
2.0000 mg | ORAL_TABLET | Freq: Every day | ORAL | Status: DC
  Filled 2018-05-18: qty 1

## 2018-05-18 MED ORDER — SODIUM CHLORIDE 0.9% IV SOLUTION: Freq: Once | INTRAVENOUS | Status: DC

## 2018-05-18 MED ORDER — AMPHETAMINE-DEXTROAMPHET ER 25 MG PO CP24: 25.0000 mg | ORAL_CAPSULE | ORAL | 0 refills | Status: AC

## 2018-05-18 MED ORDER — SODIUM CHLORIDE 0.9% FLUSH
10.0000 mL | Freq: Two times a day (BID) | INTRAVENOUS | Status: DC
  Administered 2018-05-18 – 2018-05-24 (×7): 10 mL

## 2018-05-18 MED ORDER — ESTRADIOL 2 MG PO TABS
6.0000 mg | ORAL_TABLET | Freq: Every day | ORAL | Status: DC
  Administered 2018-05-18: 6 mg via ORAL
  Filled 2018-05-18: qty 3

## 2018-05-18 MED ORDER — ESTRADIOL 2 MG PO TABS
6.0000 mg | ORAL_TABLET | Freq: Every day | ORAL | Status: DC
Start: 2018-05-19 — End: 2018-05-18

## 2018-05-18 NOTE — Anesthesia Preprocedure Evaluation (Addendum)
Anesthesia Evaluation  Patient identified by MRN, date of birth, ID band Patient awake    Reviewed: Allergy & Precautions, NPO status , Patient's Chart, lab work & pertinent test results  Airway Mallampati: IV  TM Distance: >3 FB Neck ROM: Full    Dental no notable dental hx. (+) Teeth Intact, Dental Advisory Given   Pulmonary neg pulmonary ROS,    breath sounds clear to auscultation       Cardiovascular negative cardio ROS   Rhythm:Regular Rate:Normal     Neuro/Psych negative neurological ROS     GI/Hepatic negative GI ROS, Neg liver ROS,   Endo/Other  negative endocrine ROS  Renal/GU negative Renal ROS     Musculoskeletal Pelvic fracture   Abdominal Normal abdominal exam  (+)   Peds  Hematology  (+) anemia ,   Anesthesia Other Findings   Reproductive/Obstetrics                           Lab Results  Component Value Date   WBC 9.0 05/18/2018   HGB 9.0 (L) 05/18/2018   HCT 26.8 (L) 05/18/2018   MCV 90.8 05/18/2018   PLT 227 05/18/2018   Lab Results  Component Value Date   CREATININE 0.78 05/18/2018   BUN 10 05/18/2018   NA 135 05/18/2018   K 3.5 05/18/2018   CL 105 05/18/2018   CO2 25 05/18/2018    Anesthesia Physical  Anesthesia Plan  ASA: III  Anesthesia Plan: General   Post-op Pain Management:    Induction: Intravenous  PONV Risk Score and Plan: 3 and Ondansetron, Dexamethasone, Midazolam and Treatment may vary due to age or medical condition  Airway Management Planned: Oral ETT  Additional Equipment: None  Intra-op Plan:   Post-operative Plan: Extubation in OR  Informed Consent: I have reviewed the patients History and Physical, chart, labs and discussed the procedure including the risks, benefits and alternatives for the proposed anesthesia with the patient or authorized representative who has indicated his/her understanding and acceptance.   Dental  advisory given  Plan Discussed with: CRNA  Anesthesia Plan Comments:       Anesthesia Quick Evaluation
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
## 2018-05-19 ENCOUNTER — Encounter (HOSPITAL_COMMUNITY): Payer: Self-pay | Admitting: Certified Registered Nurse Anesthetist

## 2018-05-19 ENCOUNTER — Inpatient Hospital Stay (HOSPITAL_COMMUNITY): Payer: Self-pay | Admitting: Anesthesiology

## 2018-05-19 ENCOUNTER — Inpatient Hospital Stay (HOSPITAL_COMMUNITY): Payer: Self-pay

## 2018-05-19 ENCOUNTER — Encounter (HOSPITAL_COMMUNITY): Admission: EM | Disposition: A | Discharge status: Skilled Nursing Facility | Payer: Self-pay | Source: Home / Self Care

## 2018-05-19 HISTORY — PX: ORIF PELVIC FRACTURE WITH PERCUTANEOUS SCREWS: SHX6800

## 2018-05-19 LAB — PROTIME-INR
INR: 1.18
PROTHROMBIN TIME: 14.9 s (ref 11.4–15.2)

## 2018-05-19 LAB — CBC
HCT: 21.8 % — ABNORMAL LOW (ref 36.0–46.0)
HEMOGLOBIN: 7.2 g/dL — AB (ref 12.0–15.0)
MCH: 30.3 pg (ref 26.0–34.0)
MCHC: 33 g/dL (ref 30.0–36.0)
MCV: 91.6 fL (ref 80.0–100.0)
PLATELETS: 180 10*3/uL (ref 150–400)
RBC: 2.38 MIL/uL — AB (ref 3.87–5.11)
RDW: 12.1 % (ref 11.5–15.5)
WBC: 8.6 10*3/uL (ref 4.0–10.5)
nRBC: 0 % (ref 0.0–0.2)

## 2018-05-19 LAB — POCT I-STAT 4, (NA,K, GLUC, HGB,HCT)
Glucose, Bld: 134 mg/dL — ABNORMAL HIGH (ref 70–99)
HCT: 23 % — ABNORMAL LOW (ref 36.0–46.0)
Hemoglobin: 7.8 g/dL — ABNORMAL LOW (ref 12.0–15.0)
Potassium: 3.9 mmol/L (ref 3.5–5.1)
Sodium: 138 mmol/L (ref 135–145)

## 2018-05-19 LAB — BASIC METABOLIC PANEL
Anion gap: 4 — ABNORMAL LOW (ref 5–15)
BUN: 8 mg/dL (ref 6–20)
CALCIUM: 8 mg/dL — AB (ref 8.9–10.3)
CHLORIDE: 107 mmol/L (ref 98–111)
CO2: 26 mmol/L (ref 22–32)
CREATININE: 0.73 mg/dL (ref 0.44–1.00)
GFR calc Af Amer: >60 mL/min (ref >60)
GFR calc non Af Amer: >60 mL/min (ref >60)
Glucose, Bld: 106 mg/dL — ABNORMAL HIGH (ref 70–99)
POTASSIUM: 3.3 mmol/L — AB (ref 3.5–5.1)
Sodium: 137 mmol/L (ref 135–145)

## 2018-05-19 LAB — POCT I-STAT EG7
ACID-BASE DEFICIT: 3 mmol/L — AB (ref 0.0–2.0)
BICARBONATE: 23.9 mmol/L (ref 20.0–28.0)
CALCIUM ION: 1.13 mmol/L — AB (ref 1.15–1.40)
HEMATOCRIT: 26 % — AB (ref 36.0–46.0)
HEMOGLOBIN: 8.8 g/dL — AB (ref 12.0–15.0)
O2 Saturation: 69 %
PH VEN: 7.29 (ref 7.250–7.430)
POTASSIUM: 3.9 mmol/L (ref 3.5–5.1)
SODIUM: 138 mmol/L (ref 135–145)
TCO2: 25 mmol/L (ref 22–32)
pCO2, Ven: 49.9 mmHg (ref 44.0–60.0)
pO2, Ven: 42 mmHg (ref 32.0–45.0)

## 2018-05-19 LAB — APTT: APTT: 30 s (ref 24–36)

## 2018-05-19 SURGERY — CLOSED REDUCTION, PELVIS, WITH PERCUTANEOUS FIXATION
Anesthesia: General | Site: Pelvis

## 2018-05-19 MED ORDER — ROCURONIUM BROMIDE 50 MG/5ML IV SOSY
PREFILLED_SYRINGE | INTRAVENOUS | Status: AC
  Filled 2018-05-19: qty 5

## 2018-05-19 MED ORDER — SUGAMMADEX SODIUM 500 MG/5ML IV SOLN
INTRAVENOUS | Status: AC
  Filled 2018-05-19: qty 5

## 2018-05-19 MED ORDER — SODIUM CHLORIDE 0.9 % IV SOLN
INTRAVENOUS | Status: DC | PRN
  Administered 2018-05-19: 08:00:00 via INTRAVENOUS

## 2018-05-19 MED ORDER — ALBUMIN HUMAN 5 % IV SOLN
INTRAVENOUS | Status: DC | PRN
  Administered 2018-05-19: 11:00:00 via INTRAVENOUS

## 2018-05-19 MED ORDER — HYDROMORPHONE HCL 1 MG/ML IJ SOLN
1.0000 mg | Freq: Once | INTRAMUSCULAR | Status: AC
  Administered 2018-05-19: 1 mg via INTRAVENOUS
  Filled 2018-05-19: qty 1

## 2018-05-19 MED ORDER — HYDROMORPHONE 1 MG/ML IV SOLN
INTRAVENOUS | Status: DC
  Filled 2018-05-19: qty 25

## 2018-05-19 MED ORDER — SPIRONOLACTONE 25 MG PO TABS
200.0000 mg | ORAL_TABLET | Freq: Every day | ORAL | Status: DC
  Administered 2018-05-20 – 2018-06-05 (×17): 200 mg via ORAL
  Filled 2018-05-19 (×18): qty 8

## 2018-05-19 MED ORDER — HYDROMORPHONE HCL 1 MG/ML IJ SOLN
INTRAMUSCULAR | Status: AC
  Filled 2018-05-19: qty 1

## 2018-05-19 MED ORDER — TOBRAMYCIN SULFATE 1.2 G IJ SOLR
INTRAMUSCULAR | Status: AC
  Filled 2018-05-19: qty 1.2

## 2018-05-19 MED ORDER — SODIUM CHLORIDE 0.9 % IV SOLN
INTRAVENOUS | Status: DC | PRN
  Administered 2018-05-19: 20 ug/min via INTRAVENOUS

## 2018-05-19 MED ORDER — SPIRONOLACTONE 25 MG PO TABS
100.0000 mg | ORAL_TABLET | Freq: Every day | ORAL | Status: DC
  Administered 2018-05-20 – 2018-06-04 (×16): 100 mg via ORAL
  Filled 2018-05-19 (×18): qty 4

## 2018-05-19 MED ORDER — DEXAMETHASONE SODIUM PHOSPHATE 10 MG/ML IJ SOLN
INTRAMUSCULAR | Status: AC
  Filled 2018-05-19: qty 1

## 2018-05-19 MED ORDER — SUGAMMADEX SODIUM 200 MG/2ML IV SOLN
INTRAVENOUS | Status: DC | PRN
  Administered 2018-05-19: 250 mg via INTRAVENOUS

## 2018-05-19 MED ORDER — ROCURONIUM BROMIDE 50 MG/5ML IV SOSY
PREFILLED_SYRINGE | INTRAVENOUS | Status: DC | PRN
  Administered 2018-05-19: 20 mg via INTRAVENOUS
  Administered 2018-05-19: 50 mg via INTRAVENOUS
  Administered 2018-05-19 (×5): 10 mg via INTRAVENOUS
  Administered 2018-05-19: 20 mg via INTRAVENOUS

## 2018-05-19 MED ORDER — TRANEXAMIC ACID 1000 MG/10ML IV SOLN
INTRAVENOUS | Status: DC | PRN
  Administered 2018-05-19: 2000 mg via TOPICAL

## 2018-05-19 MED ORDER — FENTANYL CITRATE (PF) 100 MCG/2ML IJ SOLN
INTRAMUSCULAR | Status: DC | PRN
  Administered 2018-05-19 (×10): 50 ug via INTRAVENOUS

## 2018-05-19 MED ORDER — VANCOMYCIN HCL 1000 MG IV SOLR
INTRAVENOUS | Status: AC
  Filled 2018-05-19: qty 1000

## 2018-05-19 MED ORDER — ONDANSETRON HCL 4 MG/2ML IJ SOLN
INTRAMUSCULAR | Status: AC
  Filled 2018-05-19: qty 4

## 2018-05-19 MED ORDER — VANCOMYCIN HCL 1000 MG IV SOLR
INTRAVENOUS | Status: DC | PRN
  Administered 2018-05-19: 1000 mg

## 2018-05-19 MED ORDER — HYDROMORPHONE 1 MG/ML IV SOLN
INTRAVENOUS | Status: DC
  Administered 2018-05-19: 19:00:00 via INTRAVENOUS
  Administered 2018-05-20: 25 mg via INTRAVENOUS
  Administered 2018-05-22: via INTRAVENOUS
  Filled 2018-05-19 (×3): qty 25

## 2018-05-19 MED ORDER — MIDAZOLAM HCL 2 MG/2ML IJ SOLN
INTRAMUSCULAR | Status: AC
  Filled 2018-05-19: qty 2

## 2018-05-19 MED ORDER — TOBRAMYCIN SULFATE 1.2 G IJ SOLR
INTRAMUSCULAR | Status: DC | PRN
  Administered 2018-05-19: 1.2 g

## 2018-05-19 MED ORDER — 0.9 % SODIUM CHLORIDE (POUR BTL) OPTIME
TOPICAL | Status: DC | PRN
  Administered 2018-05-19: 1000 mL

## 2018-05-19 MED ORDER — CLINDAMYCIN PHOSPHATE 900 MG/50ML IV SOLN
INTRAVENOUS | Status: DC | PRN
  Administered 2018-05-19: 900 mg via INTRAVENOUS

## 2018-05-19 MED ORDER — CLINDAMYCIN PHOSPHATE 900 MG/50ML IV SOLN
INTRAVENOUS | Status: AC
  Filled 2018-05-19: qty 50

## 2018-05-19 MED ORDER — TRANEXAMIC ACID-NACL 1000-0.7 MG/100ML-% IV SOLN
INTRAVENOUS | Status: DC | PRN
  Administered 2018-05-19: 1000 mg via INTRAVENOUS

## 2018-05-19 MED ORDER — MIDAZOLAM HCL 2 MG/2ML IJ SOLN
INTRAMUSCULAR | Status: DC | PRN
  Administered 2018-05-19: 2 mg via INTRAVENOUS

## 2018-05-19 MED ORDER — TRANEXAMIC ACID 1000 MG/10ML IV SOLN
2000.0000 mg | INTRAVENOUS | Status: DC
  Filled 2018-05-19: qty 20

## 2018-05-19 MED ORDER — PROPOFOL 10 MG/ML IV BOLUS
INTRAVENOUS | Status: AC
  Filled 2018-05-19: qty 20

## 2018-05-19 MED ORDER — PROPOFOL 10 MG/ML IV BOLUS
INTRAVENOUS | Status: DC | PRN
  Administered 2018-05-19: 120 mg via INTRAVENOUS

## 2018-05-19 MED ORDER — FENTANYL CITRATE (PF) 250 MCG/5ML IJ SOLN
INTRAMUSCULAR | Status: AC
  Filled 2018-05-19: qty 5

## 2018-05-19 MED ORDER — HYDROMORPHONE HCL 1 MG/ML IJ SOLN
0.5000 mg | INTRAMUSCULAR | Status: DC | PRN
  Administered 2018-05-19 (×2): 0.5 mg via INTRAVENOUS

## 2018-05-19 MED ORDER — LIDOCAINE 2% (20 MG/ML) 5 ML SYRINGE
INTRAMUSCULAR | Status: AC
  Filled 2018-05-19: qty 5

## 2018-05-19 MED ORDER — DEXMEDETOMIDINE HCL IN NACL 200 MCG/50ML IV SOLN
INTRAVENOUS | Status: DC | PRN
  Administered 2018-05-19: 4 ug via INTRAVENOUS
  Administered 2018-05-19 (×2): 8 ug via INTRAVENOUS

## 2018-05-19 MED ORDER — DEXMEDETOMIDINE HCL IN NACL 200 MCG/50ML IV SOLN
INTRAVENOUS | Status: AC
  Filled 2018-05-19: qty 50

## 2018-05-19 MED ORDER — TRANEXAMIC ACID-NACL 1000-0.7 MG/100ML-% IV SOLN
INTRAVENOUS | Status: AC
  Filled 2018-05-19: qty 100

## 2018-05-19 MED ORDER — ONDANSETRON HCL 4 MG/2ML IJ SOLN
INTRAMUSCULAR | Status: DC | PRN
  Administered 2018-05-19 (×2): 4 mg via INTRAVENOUS

## 2018-05-19 MED ORDER — DEXAMETHASONE SODIUM PHOSPHATE 10 MG/ML IJ SOLN
INTRAMUSCULAR | Status: DC | PRN
  Administered 2018-05-19: 10 mg via INTRAVENOUS

## 2018-05-19 MED ORDER — CLINDAMYCIN PHOSPHATE 900 MG/50ML IV SOLN
900.0000 mg | Freq: Three times a day (TID) | INTRAVENOUS | Status: AC
  Administered 2018-05-19 – 2018-05-20 (×3): 900 mg via INTRAVENOUS
  Filled 2018-05-19 (×3): qty 50

## 2018-05-19 SURGICAL SUPPLY — 69 items
BANDAGE ACE 3X5.8 VEL STRL LF (GAUZE/BANDAGES/DRESSINGS) ×2 IMPLANT
BIT DRILL 2.5X300 (BIT) ×1 IMPLANT
BIT DRILL CANN 4.5MM (BIT) ×2 IMPLANT
BLADE CLIPPER SURG (BLADE) IMPLANT
BLADE SURG 11 STRL SS (BLADE) ×2 IMPLANT
CHLORAPREP W/TINT 26ML (MISCELLANEOUS) ×2 IMPLANT
CONT SPEC 4OZ CLIKSEAL STRL BL (MISCELLANEOUS) ×2 IMPLANT
CONT SPEC STER OR (MISCELLANEOUS) ×2 IMPLANT
COVER WAND RF STERILE (DRAPES) ×2 IMPLANT
DERMABOND ADVANCED (GAUZE/BANDAGES/DRESSINGS) ×1
DERMABOND ADVANCED .7 DNX12 (GAUZE/BANDAGES/DRESSINGS) ×1 IMPLANT
DRAPE C-ARM 42X72 X-RAY (DRAPES) ×2 IMPLANT
DRAPE C-ARMOR (DRAPES) ×2 IMPLANT
DRAPE INCISE IOBAN 66X45 STRL (DRAPES) ×2 IMPLANT
DRAPE PROXIMA HALF (DRAPES) ×2 IMPLANT
DRAPE SURG 17X23 STRL (DRAPES) ×12 IMPLANT
DRAPE U-SHAPE 47X51 STRL (DRAPES) ×2 IMPLANT
DRAPE UNIVERSAL PACK (DRAPES) ×2 IMPLANT
DRILL BIT 2.5X300 (BIT) ×1
DRILL BIT CANN 4.5MM (BIT) ×2
DRSG MEPILEX BORDER 4X4 (GAUZE/BANDAGES/DRESSINGS) ×6 IMPLANT
DRSG MEPILEX BORDER 4X8 (GAUZE/BANDAGES/DRESSINGS) ×2 IMPLANT
ELECT REM PT RETURN 9FT ADLT (ELECTROSURGICAL) ×2
ELECTRODE REM PT RTRN 9FT ADLT (ELECTROSURGICAL) ×1 IMPLANT
GLOVE BIO SURGEON STRL SZ7.5 (GLOVE) ×8 IMPLANT
GLOVE BIOGEL PI IND STRL 7.5 (GLOVE) ×1 IMPLANT
GLOVE BIOGEL PI INDICATOR 7.5 (GLOVE) ×1
GOWN STRL REUS W/ TWL LRG LVL3 (GOWN DISPOSABLE) ×2 IMPLANT
GOWN STRL REUS W/TWL LRG LVL3 (GOWN DISPOSABLE) ×2
GUIDEWIRE 2.0MM (WIRE) ×6 IMPLANT
GUIDEWIRE THREADED 2.8MM (WIRE) ×6 IMPLANT
KIT BASIN OR (CUSTOM PROCEDURE TRAY) ×2 IMPLANT
KIT TURNOVER KIT B (KITS) ×2 IMPLANT
MANIFOLD NEPTUNE II (INSTRUMENTS) ×2 IMPLANT
NS IRRIG 1000ML POUR BTL (IV SOLUTION) ×2 IMPLANT
PACK TOTAL JOINT (CUSTOM PROCEDURE TRAY) ×2 IMPLANT
PAD ARMBOARD 7.5X6 YLW CONV (MISCELLANEOUS) ×4 IMPLANT
PLATE PELVIS 8H (Plate) ×2 IMPLANT
SCREW BONE CANN 7.3X90 S/D (Screw) ×2 IMPLANT
SCREW CANN 6.5X105 32MM THRD (Screw) ×2 IMPLANT
SCREW CANN LOCK FT 7.3X160 (Screw) ×2 IMPLANT
SCREW CORTEX 3.5 24MM (Screw) ×1 IMPLANT
SCREW CORTEX 3.5 26MM (Screw) ×1 IMPLANT
SCREW CORTEX 3.5 30MM (Screw) ×1 IMPLANT
SCREW CORTEX 3.5 34MM (Screw) ×1 IMPLANT
SCREW CORTEX 3.5 36MM (Screw) ×1 IMPLANT
SCREW CORTEX 3.5X45MM (Screw) ×2 IMPLANT
SCREW CORTEX 3.5X50MM (Screw) ×1 IMPLANT
SCREW LOCK CORT ST 3.5X24 (Screw) ×1 IMPLANT
SCREW LOCK CORT ST 3.5X26 (Screw) ×1 IMPLANT
SCREW LOCK CORT ST 3.5X30 (Screw) ×1 IMPLANT
SCREW LOCK CORT ST 3.5X34 (Screw) ×1 IMPLANT
SCREW LOCK CORT ST 3.5X36 (Screw) ×1 IMPLANT
SCREW PELVIC CORT ST 3.5X50 (Screw) ×1 IMPLANT
SCREW PELVIC CORT ST 3.5X65 (Screw) ×1 IMPLANT
SCREW SELF TAP 3.5 65M (Screw) ×1 IMPLANT
SPONGE LAP 18X18 X RAY DECT (DISPOSABLE) IMPLANT
STAPLER VISISTAT 35W (STAPLE) ×2 IMPLANT
SUCTION FRAZIER HANDLE 10FR (MISCELLANEOUS) ×1
SUCTION TUBE FRAZIER 10FR DISP (MISCELLANEOUS) ×1 IMPLANT
SUT MNCRL AB 3-0 PS2 18 (SUTURE) ×4 IMPLANT
SUT MON AB 2-0 CT1 36 (SUTURE) ×2 IMPLANT
SUT VIC AB 0 CT1 27 (SUTURE) ×2
SUT VIC AB 0 CT1 27XBRD ANBCTR (SUTURE) ×2 IMPLANT
SUT VIC AB 2-0 CT1 27 (SUTURE) ×2
SUT VIC AB 2-0 CT1 TAPERPNT 27 (SUTURE) ×2 IMPLANT
TRAY FOLEY MTR SLVR 16FR STAT (SET/KITS/TRAYS/PACK) IMPLANT
WASHER FOR 5.0 SCREWS (Washer) ×2 IMPLANT
WATER STERILE IRR 1000ML POUR (IV SOLUTION) ×2 IMPLANT

## 2018-05-19 NOTE — Anesthesia Postprocedure Evaluation (Signed)
Anesthesia Post Note  Patient: Dartha Lodgeiffany Newsome  Procedure(s) Performed: OPEN REDUCTION INTERNAL FIXATION PELVIC FRACTURE WITH PERCUTANEOUS SCREWS, APPLICATION OF SPLINT LEFT FOREARM (N/A Pelvis)     Patient location during evaluation: PACU Anesthesia Type: General Level of consciousness: sedated Pain management: pain level controlled Vital Signs Assessment: post-procedure vital signs reviewed and stable Respiratory status: spontaneous breathing and respiratory function stable Cardiovascular status: stable Postop Assessment: no apparent nausea or vomiting Anesthetic complications: no    Last Vitals:  Vitals:   05/19/18 0435 05/19/18 1238  BP:    Pulse:  (!) 119  Resp: 12 17  Temp:  36.5 C  SpO2: 96% 96%    Last Pain:  Vitals:   05/19/18 1238  TempSrc:   PainSc: Asleep    LLE Motor Response: Purposeful movement (05/19/18 1238) LLE Sensation: Full sensation (05/19/18 1238) RLE Motor Response: Purposeful movement (05/19/18 1238) RLE Sensation: Full sensation (05/19/18 1238)      Morenike Cuff DANIEL
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
## 2018-05-19 NOTE — Transfer of Care (Signed)
Immediate Anesthesia Transfer of Care Note  Patient: Amy Hampton  Procedure(s) Performed: OPEN REDUCTION INTERNAL FIXATION PELVIC FRACTURE WITH PERCUTANEOUS SCREWS, APPLICATION OF SPLINT LEFT FOREARM (N/A Pelvis)  Patient Location: PACU  Anesthesia Type:General  Level of Consciousness: awake, alert  and oriented  Airway & Oxygen Therapy: Patient Spontanous Breathing and Patient connected to face mask oxygen  Post-op Assessment: Report given to RN and Post -op Vital signs reviewed and stable  Post vital signs: Reviewed and stable  Last Vitals:  Vitals Value Taken Time  BP    Temp 36.5 C 05/19/2018 12:38 PM  Pulse 117 05/19/2018 12:38 PM  Resp 19 05/19/2018 12:38 PM  SpO2 97 % 05/19/2018 12:38 PM  Vitals shown include unvalidated device data.  Last Pain:  Vitals:   05/19/18 0435  TempSrc:   PainSc: Asleep      Patients Stated Pain Goal: 4 (05/18/18 0800)  Complications: No apparent anesthesia complications

## 2018-05-19 NOTE — Progress Notes (Signed)
Plan to proceed with surgery this AM. Hgb 7.2 plan for transfusion with 2 units and set up 2 more units. Risks and benefits discussed and she agrees to proceed with surgery.  Roby LoftsKevin P. Haddix, MD [facility] [phone] (phone)

## 2018-05-19 NOTE — Progress Notes (Signed)
Dilaudid 4ml wasted with Maryan Charamera Harris, RN

## 2018-05-19 NOTE — Progress Notes (Signed)
Dilaudid 20mg  wasted with Jeanella Antonrystal Mitchell, RN

## 2018-05-19 NOTE — Anesthesia Procedure Notes (Signed)
Procedure Name: Intubation Date/Time: 05/19/2018 7:39 AM Performed by: Pearson Grippeobertson, Abreanna Drawdy M, CRNA Pre-anesthesia Checklist: Patient identified, Emergency Drugs available, Suction available and Patient being monitored Patient Re-evaluated:Patient Re-evaluated prior to induction Oxygen Delivery Method: Circle system utilized Preoxygenation: Pre-oxygenation with 100% oxygen Induction Type: IV induction Ventilation: Mask ventilation without difficulty and Oral airway inserted - appropriate to patient size Laryngoscope Size: Hyacinth MeekerMiller and 2 Grade View: Grade II Tube type: Oral Number of attempts: 1 Airway Equipment and Method: Stylet and Oral airway Placement Confirmation: ETT inserted through vocal cords under direct vision,  positive ETCO2 and breath sounds checked- equal and bilateral Secured at: 22 cm Tube secured with: Tape Dental Injury: Teeth and Oropharynx as per pre-operative assessment

## 2018-05-19 NOTE — Progress Notes (Signed)
Orthopedic Tech Progress Note Patient Details:  Dartha Lodgeiffany Bhola 01/18/1986 [id number]  Ortho Devices Type of Ortho Device: Wrist splint Ortho Device/Splint Interventions: Application   Post Interventions Patient Tolerated: Well Instructions Provided: Care of device   Saul FordyceJennifer C Breindel Collier 05/19/2018, 6:30 PM
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
## 2018-05-20 ENCOUNTER — Inpatient Hospital Stay (HOSPITAL_COMMUNITY): Payer: Self-pay

## 2018-05-20 LAB — CBC WITH DIFFERENTIAL/PLATELET
ABS IMMATURE GRANULOCYTES: 0.05 10*3/uL (ref 0.00–0.07)
BASOS ABS: 0 10*3/uL (ref 0.0–0.1)
BASOS PCT: 0 %
EOS ABS: 0.1 10*3/uL (ref 0.0–0.5)
Eosinophils Relative: 1 %
HCT: 29.2 % — ABNORMAL LOW (ref 36.0–46.0)
Hemoglobin: 9.7 g/dL — ABNORMAL LOW (ref 12.0–15.0)
IMMATURE GRANULOCYTES: 1 %
Lymphocytes Relative: 14 %
Lymphs Abs: 1.5 10*3/uL (ref 0.7–4.0)
MCH: 29.3 pg (ref 26.0–34.0)
MCHC: 33.2 g/dL (ref 30.0–36.0)
MCV: 88.2 fL (ref 80.0–100.0)
MONOS PCT: 7 %
Monocytes Absolute: 0.8 10*3/uL (ref 0.1–1.0)
NEUTROS ABS: 8.4 10*3/uL — AB (ref 1.7–7.7)
NEUTROS PCT: 77 %
NRBC: 0 % (ref 0.0–0.2)
PLATELETS: 216 10*3/uL (ref 150–400)
RBC: 3.31 MIL/uL — AB (ref 3.87–5.11)
RDW: 13 % (ref 11.5–15.5)
WBC: 10.8 10*3/uL — ABNORMAL HIGH (ref 4.0–10.5)

## 2018-05-20 LAB — BASIC METABOLIC PANEL
ANION GAP: 5 (ref 5–15)
BUN: 5 mg/dL — ABNORMAL LOW (ref 6–20)
CALCIUM: 7.4 mg/dL — AB (ref 8.9–10.3)
CO2: 25 mmol/L (ref 22–32)
Chloride: 105 mmol/L (ref 98–111)
Creatinine, Ser: 0.68 mg/dL (ref 0.44–1.00)
GLUCOSE: 121 mg/dL — AB (ref 70–99)
POTASSIUM: 3.1 mmol/L — AB (ref 3.5–5.1)
Sodium: 135 mmol/L (ref 135–145)

## 2018-05-20 MED ORDER — KETOROLAC TROMETHAMINE 30 MG/ML IJ SOLN
15.0000 mg | Freq: Four times a day (QID) | INTRAMUSCULAR | Status: AC
  Administered 2018-05-20 – 2018-05-23 (×12): 15 mg via INTRAVENOUS
  Filled 2018-05-20 (×12): qty 1

## 2018-05-20 MED ORDER — POLYETHYLENE GLYCOL 3350 17 G PO PACK
17.0000 g | PACK | Freq: Every day | ORAL | Status: DC
  Administered 2018-05-20 – 2018-06-05 (×17): 17 g via ORAL
  Filled 2018-05-20 (×17): qty 1

## 2018-05-20 MED ORDER — POTASSIUM CHLORIDE CRYS ER 20 MEQ PO TBCR
40.0000 meq | EXTENDED_RELEASE_TABLET | Freq: Once | ORAL | Status: AC
  Administered 2018-05-20: 40 meq via ORAL
  Filled 2018-05-20: qty 2

## 2018-05-20 MED ORDER — DOCUSATE SODIUM 100 MG PO CAPS
100.0000 mg | ORAL_CAPSULE | Freq: Two times a day (BID) | ORAL | Status: DC
  Administered 2018-05-20 – 2018-06-05 (×33): 100 mg via ORAL
  Filled 2018-05-20 (×33): qty 1

## 2018-05-20 MED ORDER — ACETAMINOPHEN 500 MG PO TABS
1000.0000 mg | ORAL_TABLET | Freq: Four times a day (QID) | ORAL | Status: DC
  Administered 2018-05-20 – 2018-05-24 (×18): 1000 mg via ORAL
  Filled 2018-05-20 (×19): qty 2

## 2018-05-20 MED ORDER — METHOCARBAMOL 500 MG PO TABS
500.0000 mg | ORAL_TABLET | Freq: Three times a day (TID) | ORAL | Status: DC
  Administered 2018-05-20 – 2018-05-24 (×13): 500 mg via ORAL
  Filled 2018-05-20 (×13): qty 1
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
## 2018-05-21 LAB — TYPE AND SCREEN
ABO/RH(D): A NEG
Antibody Screen: NEGATIVE
UNIT DIVISION: 0
UNIT DIVISION: 0
UNIT DIVISION: 0
Unit division: 0
Unit division: 0
Unit division: 0

## 2018-05-21 LAB — CBC
HEMATOCRIT: 27.1 % — AB (ref 36.0–46.0)
HEMOGLOBIN: 8.9 g/dL — AB (ref 12.0–15.0)
MCH: 29.5 pg (ref 26.0–34.0)
MCHC: 32.8 g/dL (ref 30.0–36.0)
MCV: 89.7 fL (ref 80.0–100.0)
Platelets: 216 10*3/uL (ref 150–400)
RBC: 3.02 MIL/uL — ABNORMAL LOW (ref 3.87–5.11)
RDW: 12.9 % (ref 11.5–15.5)
WBC: 9.5 10*3/uL (ref 4.0–10.5)
nRBC: 0 % (ref 0.0–0.2)

## 2018-05-21 LAB — BPAM RBC
BLOOD PRODUCT EXPIRATION DATE: 201912072359
BLOOD PRODUCT EXPIRATION DATE: 201912102359
BLOOD PRODUCT EXPIRATION DATE: 201912102359
Blood Product Expiration Date: 201912052359
Blood Product Expiration Date: 201912052359
Blood Product Expiration Date: 201912102359
ISSUE DATE / TIME: 201911221055
ISSUE DATE / TIME: 201911111059
ISSUE DATE / TIME: 201911220733
ISSUE DATE / TIME: 201911220733
ISSUE DATE / TIME: 201911221055
UNIT TYPE AND RH: 600
UNIT TYPE AND RH: 600
UNIT TYPE AND RH: 600
Unit Type and Rh: 600
Unit Type and Rh: 600
Unit Type and Rh: 600

## 2018-05-21 LAB — BASIC METABOLIC PANEL
Anion gap: 5 (ref 5–15)
BUN: 5 mg/dL — AB (ref 6–20)
CO2: 28 mmol/L (ref 22–32)
Calcium: 7.8 mg/dL — ABNORMAL LOW (ref 8.9–10.3)
Chloride: 103 mmol/L (ref 98–111)
Creatinine, Ser: 0.61 mg/dL (ref 0.44–1.00)
GFR calc Af Amer: >60 mL/min (ref >60)
GLUCOSE: 107 mg/dL — AB (ref 70–99)
POTASSIUM: 3 mmol/L — AB (ref 3.5–5.1)
Sodium: 136 mmol/L (ref 135–145)

## 2018-05-21 MED ORDER — POTASSIUM CHLORIDE CRYS ER 20 MEQ PO TBCR
40.0000 meq | EXTENDED_RELEASE_TABLET | Freq: Two times a day (BID) | ORAL | Status: AC
  Administered 2018-05-21 (×2): 40 meq via ORAL
  Filled 2018-05-21 (×2): qty 2
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
## 2018-05-22 ENCOUNTER — Encounter (HOSPITAL_COMMUNITY): Payer: Self-pay | Admitting: Student

## 2018-05-22 DIAGNOSIS — S32810A Multiple fractures of pelvis with stable disruption of pelvic ring, initial encounter for closed fracture: Principal | ICD-10-CM

## 2018-05-22 DIAGNOSIS — E876 Hypokalemia: Secondary | ICD-10-CM

## 2018-05-22 DIAGNOSIS — R0682 Tachypnea, not elsewhere classified: Secondary | ICD-10-CM

## 2018-05-22 DIAGNOSIS — D62 Acute posthemorrhagic anemia: Secondary | ICD-10-CM

## 2018-05-22 DIAGNOSIS — R000 Tachycardia, unspecified: Secondary | ICD-10-CM

## 2018-05-22 DIAGNOSIS — G8918 Other acute postprocedural pain: Secondary | ICD-10-CM

## 2018-05-22 LAB — CBC
HEMATOCRIT: 28.2 % — AB (ref 36.0–46.0)
HEMOGLOBIN: 9.4 g/dL — AB (ref 12.0–15.0)
MCH: 29.7 pg (ref 26.0–34.0)
MCHC: 33.3 g/dL (ref 30.0–36.0)
MCV: 89 fL (ref 80.0–100.0)
NRBC: 0 % (ref 0.0–0.2)
Platelets: 217 10*3/uL (ref 150–400)
RBC: 3.17 MIL/uL — AB (ref 3.87–5.11)
RDW: 12.7 % (ref 11.5–15.5)
WBC: 7.8 10*3/uL (ref 4.0–10.5)

## 2018-05-22 LAB — BASIC METABOLIC PANEL
ANION GAP: 5 (ref 5–15)
BUN: 7 mg/dL (ref 6–20)
CHLORIDE: 102 mmol/L (ref 98–111)
CO2: 29 mmol/L (ref 22–32)
Calcium: 8.4 mg/dL — ABNORMAL LOW (ref 8.9–10.3)
Creatinine, Ser: 0.64 mg/dL (ref 0.44–1.00)
GFR calc Af Amer: >60 mL/min (ref >60)
GFR calc non Af Amer: >60 mL/min (ref >60)
Glucose, Bld: 127 mg/dL — ABNORMAL HIGH (ref 70–99)
POTASSIUM: 3.6 mmol/L (ref 3.5–5.1)
Sodium: 136 mmol/L (ref 135–145)

## 2018-05-22 MED ORDER — OXYCODONE HCL 5 MG PO TABS
10.0000 mg | ORAL_TABLET | ORAL | Status: DC | PRN
  Administered 2018-05-22 (×2): 15 mg via ORAL
  Administered 2018-05-23: 10 mg via ORAL
  Administered 2018-05-23 (×3): 15 mg via ORAL
  Administered 2018-05-24: 10 mg via ORAL
  Administered 2018-05-24 (×2): 15 mg via ORAL
  Filled 2018-05-22 (×3): qty 3
  Filled 2018-05-22: qty 2
  Filled 2018-05-22 (×2): qty 3
  Filled 2018-05-22: qty 2
  Filled 2018-05-22 (×2): qty 3

## 2018-05-22 MED ORDER — HYDROMORPHONE HCL 1 MG/ML IJ SOLN
1.0000 mg | INTRAMUSCULAR | Status: DC | PRN
  Administered 2018-05-22 – 2018-05-26 (×19): 1 mg via INTRAVENOUS
  Filled 2018-05-22 (×19): qty 1

## 2018-05-22 MED ORDER — TRAMADOL HCL 50 MG PO TABS
50.0000 mg | ORAL_TABLET | Freq: Four times a day (QID) | ORAL | Status: DC
  Administered 2018-05-22 – 2018-05-24 (×10): 50 mg via ORAL
  Filled 2018-05-22 (×11): qty 1
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
## 2018-05-22 NOTE — Progress Notes (Signed)
Inpatient Rehabilitation-Admissions Coordinator    Met with patient and her mother at the bedside as follow up from PM&R consult. AC shared booklets, expectations for CIR, expected length of stay if pt was to be admitted to CIR, and anticipated functional level at DC.   [facility] discussed current limitations, like DC environment, medical readiness, tolerance with therapies, and pain. Pt and mother under the impression that she would have two weeks of healing prior to starting rehab. AC discussed potential timeline and discussed necessary hurdles pt would need to accomplish to show tolerance for CIR program, including progressively tolerating close to recliner position when in bed with goal to tolerate OOB activities for an hour at a time. Per RN, pt off PCA but still receiving IV pain medication.   AC will follow up with SW regarding DC environment (per family request) as pt lives on 2nd story of apartment building with stairs only. AC will also follow up with financial counselor for assistance in this case.   Please call if questions.   Jhonnie Garner, OTR/L  Rehab Admissions Coordinator  [phone] 05/22/2018 6:14 PM

## 2018-05-23 ENCOUNTER — Encounter (HOSPITAL_COMMUNITY): Payer: Self-pay | Admitting: Student

## 2018-05-23 NOTE — Progress Notes (Signed)
Trauma Service Note  Subjective: Patient in no distress  Objective: Vital signs in last 24 hours: Temp:  [98 F (36.7 C)-99 F (37.2 C)] 98 F (36.7 C) (11/26 0800) Pulse Rate:  [100-114] 100 (11/26 0800) Resp:  [20-24] 24 (11/26 0800) BP: (111-134)/(79-92) 134/80 (11/26 0800) SpO2:  [96 %-97 %] 97 % (11/26 0800) Last BM Date: 05/21/18  Intake/Output from previous day: 11/25 0701 - 11/26 0700 In: 1130.4 [P.O.:360; I.V.:770.4] Out: 2475 [Urine:2475] Intake/Output this shift: Total I/O In: 240 [P.O.:240] Out: 400 [Urine:400]  General: No acute distress  Lungs: Clear  Abd: Soft, hypoactive bowel sounds.  Extremities: No changes.  No weight bearing for several weeks.  Neuro: Intact  Lab Results: CBC  Recent Labs    05/21/18 0625 05/22/18 0500  WBC 9.5 7.8  HGB 8.9* 9.4*  HCT 27.1* 28.2*  PLT 216 217   BMET Recent Labs    05/21/18 0625 05/22/18 0500  NA 136 136  K 3.0* 3.6  CL 103 102  CO2 28 29  GLUCOSE 107* 127*  BUN 5* 7  CREATININE 0.61 0.64  CALCIUM 7.8* 8.4*   PT/INR No results for input(s): LABPROT, INR in the last 72 hours. ABG No results for input(s): PHART, HCO3 in the last 72 hours.  Invalid input(s): PCO2, PO2  Studies/Results: No results found.  Anti-infectives: Anti-infectives (From admission, onward)   Start     Dose/Rate Route Frequency Ordered Stop   05/19/18 1630  clindamycin (CLEOCIN) IVPB 900 mg    Note to Pharmacy:  Please give 8 hours from intraoperative dose   900 mg 100 mL/hr over 30 Minutes Intravenous Every 8 hours 05/19/18 1407 05/20/18 0605   05/19/18 1124  vancomycin (VANCOCIN) powder  Status:  Discontinued       As needed 05/19/18 1124 05/19/18 1232   05/19/18 1124  tobramycin (NEBCIN) powder  Status:  Discontinued       As needed 05/19/18 1124 05/19/18 1232   05/17/18 1418  clindamycin (CLEOCIN) 900 MG/50ML IVPB  Status:  Discontinued    Note to Pharmacy:  [name], [name]   : cabinet override      05/17/18  1418 05/17/18 1444   05/17/18 1045  clindamycin (CLEOCIN) IVPB 900 mg     900 mg 100 mL/hr over 30 Minutes Intravenous On call to O.R. 05/17/18 0932 05/17/18 1604      Assessment/Plan: s/p Procedure(s): OPEN REDUCTION INTERNAL FIXATION PELVIC FRACTURE WITH PERCUTANEOUS SCREWS, APPLICATION OF SPLINT LEFT FOREARM Continue working with therapies for transfers.  LOS: 6 days   Marta LamasJames O. Gae BonWyatt, III, MD, FACS [phone] Trauma Surgeon 05/23/2018
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
## 2018-05-24 ENCOUNTER — Other Ambulatory Visit: Payer: Self-pay

## 2018-05-24 ENCOUNTER — Encounter (HOSPITAL_COMMUNITY): Payer: Self-pay | Admitting: General Practice

## 2018-05-24 LAB — CBC
HCT: 33.3 % — ABNORMAL LOW (ref 36.0–46.0)
HEMOGLOBIN: 10.8 g/dL — AB (ref 12.0–15.0)
MCH: 29 pg (ref 26.0–34.0)
MCHC: 32.4 g/dL (ref 30.0–36.0)
MCV: 89.5 fL (ref 80.0–100.0)
Platelets: 355 10*3/uL (ref 150–400)
RBC: 3.72 MIL/uL — AB (ref 3.87–5.11)
RDW: 12.9 % (ref 11.5–15.5)
WBC: 15.4 10*3/uL — AB (ref 4.0–10.5)
nRBC: 0 % (ref 0.0–0.2)

## 2018-05-24 LAB — CREATININE, SERUM
Creatinine, Ser: 0.69 mg/dL (ref 0.44–1.00)
GFR calc Af Amer: >60 mL/min (ref >60)
GFR calc non Af Amer: >60 mL/min (ref >60)

## 2018-05-24 MED ORDER — KETOROLAC TROMETHAMINE 15 MG/ML IJ SOLN
15.0000 mg | Freq: Four times a day (QID) | INTRAMUSCULAR | Status: AC
  Administered 2018-05-24 – 2018-05-29 (×20): 15 mg via INTRAVENOUS
  Filled 2018-05-24 (×20): qty 1

## 2018-05-24 MED ORDER — HYDROCODONE-ACETAMINOPHEN 10-325 MG PO TABS
1.0000 | ORAL_TABLET | Freq: Four times a day (QID) | ORAL | Status: DC | PRN
  Administered 2018-05-25 – 2018-05-26 (×4): 2 via ORAL
  Filled 2018-05-24 (×4): qty 2

## 2018-05-24 MED ORDER — METHOCARBAMOL 500 MG PO TABS
750.0000 mg | ORAL_TABLET | Freq: Three times a day (TID) | ORAL | Status: DC
  Administered 2018-05-24 – 2018-05-31 (×21): 750 mg via ORAL
  Filled 2018-05-24 (×21): qty 2

## 2018-05-24 MED ORDER — GABAPENTIN 300 MG PO CAPS
300.0000 mg | ORAL_CAPSULE | Freq: Three times a day (TID) | ORAL | Status: DC
  Administered 2018-05-24 – 2018-05-31 (×20): 300 mg via ORAL
  Filled 2018-05-24 (×20): qty 1

## 2018-05-24 MED ORDER — GABAPENTIN 100 MG PO CAPS
200.0000 mg | ORAL_CAPSULE | Freq: Three times a day (TID) | ORAL | Status: DC
  Administered 2018-05-24: 200 mg via ORAL
  Filled 2018-05-24: qty 2

## 2018-05-24 MED ORDER — ACETAMINOPHEN 500 MG PO TABS
500.0000 mg | ORAL_TABLET | Freq: Two times a day (BID) | ORAL | Status: DC
  Administered 2018-05-24 – 2018-05-28 (×9): 500 mg via ORAL
  Filled 2018-05-24 (×11): qty 1

## 2018-05-24 NOTE — Clinical Social Work Note (Signed)
Clinical Social Worker continuing to follow patient and family for support and discharge planning needs.  Patient requested documentation for apartment complex - CSW provided and emailed as requested.  CSW to initiate bed search for LOG SNF placement.  CSW remains available for support and to facilitate patient discharge needs once medically stable and bed available.  Macario GoldsJesse Javis Abboud, [place] [phone]

## 2018-05-24 NOTE — Progress Notes (Signed)
Orthopaedic Trauma Service Progress Note  Pt seen on evening rounds today  Chart has been reviewed Minimal mobility with therapies. Think this is multifactorial but I think that pain control is the primary issue along with some anxiety  Discussed at length expectations with patient and mom  Really would like for pt to get to the chair tomorrow either via lift or slide transfer. If not tomorrow then Friday at the latest. Discussed with pt and mom that failure to progress with therapy indicates that pt will move on the the next level of care which would most likely be a SNF setting   Patient has not worked with therapy today.  Patient states it has been a really rough day and stressful.  Patient is lying completely supine in bed. Reports pretty significant muscle spasms in left leg along with burning and shooting pain left leg and foot  Patient has been mildly tachycardic all day.  No chest pain or shortness of breath noted however.  No abdominal pain  Patient appears quite apprehensive and anxious   BP 128/77 (BP Location: Right Arm)   Pulse (!) 107   Temp 98 F (36.7 C) (Oral)   Resp (!) 23   Ht 5\' 9"  (1.753 m)   Wt 113.4 kg   SpO2 94%   BMI 36.92 kg/m    Exam  Gen: Supine in bed, no acute distress but appears to be a little anxious and fatigued Pelvis/B LEx  Dressings are stable  Motor and sensory functions are intact and symmetric with the exception of 4+ EHL on the left side  Sensation improving to the dorsum of the left foot and states that sensation feels symmetric to contralateral side  Extremities are warm   + DP pulses  Scrotal edema stable  Plan   Again discussed expectations of therapy with patient and mom.  As counterintuitive may seem it is imperative for her to get up to the chair with assistance to maximize her recovery and to minimize her pain.  Patient should continue with bed mobility as much as possible throughout the day.  Overhead frames was reordered.   Overhead frame applied by myself and the [employer]  Will adjust pain medications to see if we can improve control to maximize participation with therapy   Discontinue oxycodone   Norco 10/325 1-2 every 6 hours as needed for moderate to severe pain   Tylenol 500 mg 1 every 12 hours scheduled   Robaxin 750 mg every 8 hours scheduled   Increase Neurontin to 300 mg every 8 hours scheduled   + Toradol 15 mg IV every 6 hours scheduled x5 days    Continue with cryotherapy and scrotal swelling  Additionally reviewed patient's medical history.  Specifically looking at factors that may impede bone healing.  She has been on Adderall or similar medication since middle school.  She has been on estrogen therapy for 6 years.  Would like to check a metabolic bone labs to see if there is anything that we can do to minimize her risk for nonunion and possibly future fractures.  She is definitely setting herself up to altered bone metabolism given the use of exogenous estrogen.  Would absolutely recommend a bone density scan after discharge to evaluate bone quality and this needs to be monitored with a bone density scan every 1 to 2 years given the use of exogenous estrogen.  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  This can be discussed with the patient in the outpatient setting  Fatigue and tachycardia- check CBC   We will continue to palpation.  We will see patient on Friday  Mearl Latin, PA-C [phone] (C) 05/24/2018, 5:41 PM  [facility] [address] [place] [address] [phone] (F)

## 2018-05-24 NOTE — Plan of Care (Signed)
  Problem: Education: Goal: Knowledge of General Education information will improve Description Including pain rating scale, medication(s)/side effects and non-pharmacologic comfort measures Outcome: Progressing   

## 2018-05-24 NOTE — Progress Notes (Signed)
Orthopedic Tech Progress Note Patient Details:  Dartha Lodgeiffany Langdon 07/30/1985 [id number]  Patient ID: Dartha Lodgeiffany Knipple, female   DOB: 01/10/1986, 32 y.o.   MRN: [id number] Applied ohf frame with pa's assistance.  Trinna PostMartinez, Romar Woodrick J 05/24/2018, 5:19 PM
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
## 2018-05-25 DIAGNOSIS — Z789 Other specified health status: Secondary | ICD-10-CM

## 2018-05-25 DIAGNOSIS — S52612A Displaced fracture of left ulna styloid process, initial encounter for closed fracture: Secondary | ICD-10-CM

## 2018-05-25 DIAGNOSIS — F640 Transsexualism: Secondary | ICD-10-CM

## 2018-05-25 LAB — COMPREHENSIVE METABOLIC PANEL
ALK PHOS: 72 U/L (ref 38–126)
ALT: 33 U/L (ref 0–44)
AST: 18 U/L (ref 15–41)
Albumin: 2.8 g/dL — ABNORMAL LOW (ref 3.5–5.0)
Anion gap: 10 (ref 5–15)
BILIRUBIN TOTAL: 1.2 mg/dL (ref 0.3–1.2)
BUN: 13 mg/dL (ref 6–20)
CALCIUM: 8.8 mg/dL — AB (ref 8.9–10.3)
CO2: 24 mmol/L (ref 22–32)
CREATININE: 0.87 mg/dL (ref 0.44–1.00)
Chloride: 100 mmol/L (ref 98–111)
GFR calc non Af Amer: >60 mL/min (ref >60)
Glucose, Bld: 100 mg/dL — ABNORMAL HIGH (ref 70–99)
Potassium: 4.1 mmol/L (ref 3.5–5.1)
SODIUM: 134 mmol/L — AB (ref 135–145)
Total Protein: 6.7 g/dL (ref 6.5–8.1)

## 2018-05-25 LAB — VITAMIN D 25 HYDROXY (VIT D DEFICIENCY, FRACTURES): Vit D, 25-Hydroxy: 4.6 ng/mL — ABNORMAL LOW (ref 30.0–100.0)

## 2018-05-25 LAB — CALCITRIOL (1,25 DI-OH VIT D): Vit D, 1,25-Dihydroxy: 11.4 pg/mL — ABNORMAL LOW (ref 19.9–79.3)

## 2018-05-25 NOTE — Progress Notes (Signed)
Trauma Service Note  Subjective: Patient doing she states about the same, but overall to me she looks better.  Still having lots of pain in her left leg.  Objective: Vital signs in last 24 hours: Temp:  [97.7 F (36.5 C)-98.6 F (37 C)] 98.6 F (37 C) (11/28 0820) Pulse Rate:  [91-110] 91 (11/28 0820) Resp:  [13-23] 16 (11/28 0820) BP: (114-128)/(70-81) 116/70 (11/28 0820) SpO2:  [91 %-94 %] 93 % (11/28 0820) Last BM Date: 05/21/18  Intake/Output from previous day: 11/27 0701 - 11/28 0700 In: 199.3 [I.V.:199.3] Out: 2800 [Urine:2800] Intake/Output this shift: Total I/O In: 92.1 [I.V.:92.1] Out: -   General: No acute distress  Lungs: Clear  Abd: Benign  Extremities: No changes  Neuro: Intact  Lab Results: CBC  Recent Labs    05/24/18 1655  WBC 15.4*  HGB 10.8*  HCT 33.3*  PLT 355   BMET Recent Labs    05/24/18 1541 05/25/18 0412  NA  --  134*  K  --  4.1  CL  --  100  CO2  --  24  GLUCOSE  --  100*  BUN  --  13  CREATININE 0.69 0.87  CALCIUM  --  8.8*   PT/INR No results for input(s): LABPROT, INR in the last 72 hours. ABG No results for input(s): PHART, HCO3 in the last 72 hours.  Invalid input(s): PCO2, PO2  Studies/Results: No results found.  Anti-infectives: Anti-infectives (From admission, onward)   Start     Dose/Rate Route Frequency Ordered Stop   05/19/18 1630  clindamycin (CLEOCIN) IVPB 900 mg    Note to Pharmacy:  Please give 8 hours from intraoperative dose   900 mg 100 mL/hr over 30 Minutes Intravenous Every 8 hours 05/19/18 1407 05/20/18 0605   05/19/18 1124  vancomycin (VANCOCIN) powder  Status:  Discontinued       As needed 05/19/18 1124 05/19/18 1232   05/19/18 1124  tobramycin (NEBCIN) powder  Status:  Discontinued       As needed 05/19/18 1124 05/19/18 1232   05/17/18 1418  clindamycin (CLEOCIN) 900 MG/50ML IVPB  Status:  Discontinued    Note to Pharmacy:  [name], [name]   : cabinet override      05/17/18 1418 05/17/18  1444   05/17/18 1045  clindamycin (CLEOCIN) IVPB 900 mg     900 mg 100 mL/hr over 30 Minutes Intravenous On call to O.R. 05/17/18 0932 05/17/18 1604      Assessment/Plan: s/p Procedure(s): OPEN REDUCTION INTERNAL FIXATION PELVIC FRACTURE WITH PERCUTANEOUS SCREWS, APPLICATION OF SPLINT LEFT FOREARM Will resume therapy tomorrow.  Day of rest today.  Robaxin does not seem to have had much affect yet, but will wait to see if it does by tomorrow without increasing the dosage  LOS: 8 days   Marta LamasJames O. Gae BonWyatt, III, MD, FACS [phone] Trauma Surgeon 05/25/2018

## 2018-05-25 NOTE — Progress Notes (Signed)
Patient's mother expressed patient wanting a bath early into the shift. However when the Nurse Tech went in to help with bath she was told to wait and they would call when they were ready. They never called for the bath.

## 2018-05-26 ENCOUNTER — Inpatient Hospital Stay (HOSPITAL_COMMUNITY): Payer: Self-pay

## 2018-05-26 DIAGNOSIS — M79609 Pain in unspecified limb: Secondary | ICD-10-CM

## 2018-05-26 LAB — HEPATITIS PANEL, ACUTE
Hep A IgM: NEGATIVE
Hep B C IgM: NEGATIVE
Hepatitis B Surface Ag: NEGATIVE

## 2018-05-26 LAB — TESTOSTERONE: Testosterone: 22 ng/dL (ref 8–48)

## 2018-05-26 LAB — SEX HORMONE BINDING GLOBULIN: Sex Hormone Binding: 55.6 nmol/L (ref 24.6–122.0)

## 2018-05-26 MED ORDER — CALCIUM CITRATE 950 (200 CA) MG PO TABS
200.0000 mg | ORAL_TABLET | Freq: Two times a day (BID) | ORAL | Status: DC
  Administered 2018-05-26 – 2018-06-05 (×20): 200 mg via ORAL
  Filled 2018-05-26 (×22): qty 1

## 2018-05-26 MED ORDER — HYDROCODONE-ACETAMINOPHEN 7.5-325 MG PO TABS
1.0000 | ORAL_TABLET | Freq: Four times a day (QID) | ORAL | Status: DC | PRN
  Administered 2018-05-26 – 2018-05-29 (×9): 1 via ORAL
  Filled 2018-05-26 (×10): qty 1

## 2018-05-26 MED ORDER — VITAMIN D (ERGOCALCIFEROL) 1.25 MG (50000 UNIT) PO CAPS
50000.0000 [IU] | ORAL_CAPSULE | ORAL | Status: DC
  Administered 2018-05-26 – 2018-06-02 (×2): 50000 [IU] via ORAL
  Filled 2018-05-26 (×2): qty 1

## 2018-05-26 MED ORDER — HYDROMORPHONE HCL 1 MG/ML IJ SOLN
1.0000 mg | INTRAMUSCULAR | Status: DC | PRN
  Administered 2018-05-26 – 2018-05-29 (×19): 1 mg via INTRAVENOUS
  Filled 2018-05-26 (×19): qty 1

## 2018-05-26 MED ORDER — OXYCODONE HCL ER 15 MG PO T12A
15.0000 mg | EXTENDED_RELEASE_TABLET | Freq: Two times a day (BID) | ORAL | Status: DC
  Administered 2018-05-26 – 2018-06-01 (×13): 15 mg via ORAL
  Filled 2018-05-26 (×13): qty 1

## 2018-05-26 MED ORDER — VITAMIN C 500 MG PO TABS
500.0000 mg | ORAL_TABLET | Freq: Every day | ORAL | Status: DC
  Administered 2018-05-26 – 2018-06-05 (×11): 500 mg via ORAL
  Filled 2018-05-26 (×12): qty 1

## 2018-05-26 MED ORDER — VITAMIN D 25 MCG (1000 UNIT) PO TABS
2000.0000 [IU] | ORAL_TABLET | Freq: Every day | ORAL | Status: DC
  Administered 2018-05-26 – 2018-06-05 (×11): 2000 [IU] via ORAL
  Filled 2018-05-26 (×13): qty 2
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
## 2018-05-26 NOTE — Progress Notes (Signed)
Inpatient Rehabilitation-Admissions Coordinator   [facility] to monitor for tolerance with therapies. Currently pt unable to tolerate long sit for long enough to transfer OOB. Noted pt still on IV pain medications.   Nanine MeansKelly Aylyn Wenzler, OTR/L  Rehab Admissions Coordinator  [phone] 05/26/2018 6:21 PM

## 2018-05-26 NOTE — Progress Notes (Signed)
LE venous duplex prelim: negative for DVT. Zaven Klemens Eunice, RDMS, RVT  
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
## 2018-05-27 LAB — TESTOSTERONE, FREE: TESTOSTERONE FREE: 1 pg/mL (ref 0.0–4.2)

## 2018-05-27 MED ORDER — ENSURE ENLIVE PO LIQD
237.0000 mL | Freq: Two times a day (BID) | ORAL | Status: DC
  Administered 2018-05-27 – 2018-06-05 (×15): 237 mL via ORAL
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
## 2018-05-28 LAB — PTH, INTACT AND CALCIUM
CALCIUM TOTAL (PTH): 9.6 mg/dL (ref 8.7–10.2)
PTH: 27 pg/mL (ref 15–65)
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
## 2018-05-29 MED ORDER — HYDROMORPHONE HCL 1 MG/ML IJ SOLN: 1.0000 mg | Freq: Four times a day (QID) | INTRAMUSCULAR | Status: DC | PRN

## 2018-05-29 MED ORDER — HYDROMORPHONE HCL 1 MG/ML IJ SOLN
1.0000 mg | INTRAMUSCULAR | Status: DC | PRN
  Administered 2018-05-29 – 2018-06-05 (×23): 1 mg via INTRAVENOUS
  Filled 2018-05-29 (×24): qty 1

## 2018-05-29 MED ORDER — HYDROCODONE-ACETAMINOPHEN 7.5-325 MG PO TABS
1.0000 | ORAL_TABLET | Freq: Once | ORAL | Status: AC
  Administered 2018-05-29: 1 via ORAL

## 2018-05-29 MED ORDER — HYDROCODONE-ACETAMINOPHEN 7.5-325 MG PO TABS
1.0000 | ORAL_TABLET | ORAL | Status: DC | PRN
  Administered 2018-05-29 – 2018-05-30 (×5): 1 via ORAL
  Filled 2018-05-29 (×6): qty 1

## 2018-05-29 MED ORDER — TAMSULOSIN HCL 0.4 MG PO CAPS
0.4000 mg | ORAL_CAPSULE | Freq: Every day | ORAL | Status: DC
  Administered 2018-05-30 – 2018-06-05 (×7): 0.4 mg via ORAL
  Filled 2018-05-29 (×7): qty 1

## 2018-05-29 NOTE — Clinical Social Work Note (Signed)
Clinical Social Worker continuing to follow patient and family for support and discharge planning needs.  Patient continues to remain on difficult to place list and CSW will communicate with social work leadership regarding placement options.  CSW has updated patient and mother at bedside.  CSW remains available for support.  Macario GoldsJesse Mouna Yager, [place] [phone]
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
## 2018-05-30 LAB — TESTOSTERONE, % FREE: TESTOSTERONE-% FREE: 1.6 % — AB (ref 0.2–0.7)

## 2018-05-30 MED ORDER — HYDROCODONE-ACETAMINOPHEN 7.5-325 MG PO TABS: 1.0000 | ORAL_TABLET | Freq: Once | ORAL | Status: DC

## 2018-05-30 MED ORDER — ACETAMINOPHEN 500 MG PO TABS: 1000.0000 mg | ORAL_TABLET | Freq: Three times a day (TID) | ORAL | Status: DC

## 2018-05-30 MED ORDER — HYDROCODONE-ACETAMINOPHEN 10-325 MG PO TABS
1.0000 | ORAL_TABLET | ORAL | Status: DC | PRN
  Administered 2018-05-30 – 2018-06-05 (×21): 1 via ORAL
  Filled 2018-05-30 (×21): qty 1

## 2018-05-30 MED ORDER — ACETAMINOPHEN 500 MG PO TABS
500.0000 mg | ORAL_TABLET | Freq: Two times a day (BID) | ORAL | Status: DC
  Administered 2018-05-30 – 2018-06-04 (×11): 500 mg via ORAL
  Filled 2018-05-30 (×10): qty 1

## 2018-05-30 NOTE — Progress Notes (Signed)
PT Cancellation Note  Patient Details Name: Amy Hampton MRN: [id number] DOB: 12/29/1985   Cancelled Treatment:    Reason Eval/Treat Not Completed: Pain limiting ability to participate. Pt declining participation due to pain. Pt not scheduled to receive pain meds until 12:30. Note added to PT log that pt needs to be premedicated in order to promote successful session on next attempt.   Ilda FoilGarrow, Azalee Weimer Rene 05/30/2018, 11:10 AM   Aida RaiderWendy Porshia Blizzard, PT  Office # [phone] Pager [phone]

## 2018-05-30 NOTE — Progress Notes (Signed)
Trauma Service Note  Subjective: Pain had been an issue for the last 24 hours.  Trying to come off the IV Dilaudid  Objective: Vital signs in last 24 hours: Temp:  [97.8 F (36.6 C)-98.1 F (36.7 C)] 98 F (36.7 C) (12/03 0600) Pulse Rate:  [91-124] 95 (12/03 0600) Resp:  [19-97] 20 (12/03 0600) BP: (112-126)/(69-78) 112/74 (12/03 0600) SpO2:  [95 %-98 %] 98 % (12/02 2145) Last BM Date: 05/26/18  Intake/Output from previous day: 12/02 0701 - 12/03 0700 In: 240 [P.O.:240] Out: 1250 [Urine:1250] Intake/Output this shift: No intake/output data recorded.  General: No acute distress, but states that she is in constant pain.,  Lungs: Clear  Abd: Benign  Extremities: Scrotal swelling markedly improved.  Foley still in place   Neuro: No changes  Lab Results: CBC  No results for input(s): WBC, HGB, HCT, PLT in the last 72 hours. BMET No results for input(s): NA, K, CL, CO2, GLUCOSE, BUN, CREATININE, CALCIUM in the last 72 hours. PT/INR No results for input(s): LABPROT, INR in the last 72 hours. ABG No results for input(s): PHART, HCO3 in the last 72 hours.  Invalid input(s): PCO2, PO2  Studies/Results: No results found.  Anti-infectives: Anti-infectives (From admission, onward)   Start     Dose/Rate Route Frequency Ordered Stop   05/19/18 1630  clindamycin (CLEOCIN) IVPB 900 mg    Note to Pharmacy:  Please give 8 hours from intraoperative dose   900 mg 100 mL/hr over 30 Minutes Intravenous Every 8 hours 05/19/18 1407 05/20/18 0605   05/19/18 1124  vancomycin (VANCOCIN) powder  Status:  Discontinued       As needed 05/19/18 1124 05/19/18 1232   05/19/18 1124  tobramycin (NEBCIN) powder  Status:  Discontinued       As needed 05/19/18 1124 05/19/18 1232   05/17/18 1418  clindamycin (CLEOCIN) 900 MG/50ML IVPB  Status:  Discontinued    Note to Pharmacy:  [name], [name]   : cabinet override      05/17/18 1418 05/17/18 1444   05/17/18 1045  clindamycin (CLEOCIN) IVPB 900  mg     900 mg 100 mL/hr over 30 Minutes Intravenous On call to O.R. 05/17/18 0932 05/17/18 1604      Assessment/Plan: s/p Procedure(s): OPEN REDUCTION INTERNAL FIXATION PELVIC FRACTURE WITH PERCUTANEOUS SCREWS, APPLICATION OF SPLINT LEFT FOREARM Increase Norco to 10/325 one tablet q4h  Will keep Oxycontin at 15mg   LOS: 13 days   Marta LamasJames O. Gae BonWyatt, III, MD, FACS [phone] Trauma Surgeon 05/30/2018

## 2018-05-30 NOTE — Progress Notes (Signed)
Inpatient Rehabilitation-Admissions Coordinator   Pt has not made enough progress with therapies to warrant a CIR stay. [facility]. AC will sign off.   Please call if questions.   Nanine MeansKelly Almedia Cordell, OTR/L  Rehab Admissions Coordinator  [phone] 05/30/2018 4:12 PM

## 2018-05-30 NOTE — Clinical Social Work Note (Signed)
Clinical Social Worker continuing to follow patient and family for support and discharge planning needs.  [facility].  CSW has updated patient, family, and MD of discharge plans.  CSW remains available for support and to assist with discharge planning needs.  Amy GoldsJesse Kaliel Bolds, [place] [phone]
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
## 2018-05-31 LAB — CREATININE, SERUM
CREATININE: 0.95 mg/dL (ref 0.44–1.00)
GFR calc Af Amer: >60 mL/min (ref >60)
GFR calc non Af Amer: >60 mL/min (ref >60)

## 2018-05-31 MED ORDER — METHOCARBAMOL 500 MG PO TABS
1000.0000 mg | ORAL_TABLET | Freq: Three times a day (TID) | ORAL | Status: DC
  Administered 2018-05-31 – 2018-06-05 (×15): 1000 mg via ORAL
  Filled 2018-05-31 (×15): qty 2

## 2018-05-31 MED ORDER — PREGABALIN 50 MG PO CAPS
75.0000 mg | ORAL_CAPSULE | Freq: Two times a day (BID) | ORAL | Status: DC
  Administered 2018-05-31 – 2018-06-01 (×3): 75 mg via ORAL
  Filled 2018-05-31 (×3): qty 1
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
## 2018-05-31 NOTE — Progress Notes (Signed)
Patient continues tho have difficulty with pain management.  Patient has attempted other techniques before call RN in before asking for pain meds..ie deep breathing.  Patient seems anxious and may benefit for anxiety med, PO Dilaudid, and breakthrough po Oxycodone. Her pain is excruciating to her left leg and only has short periods of rest.

## 2018-05-31 NOTE — Consult Note (Addendum)
Attempted to speak to patient. She reports that she was under the impression that she would be speaking to a therapist. She declines psych consult/medication management at this time. She would like resources to establish care with an outpatient therapist. Please have SW provide her with resources on discharge.   Juanetta BeetsJacqueline Eileen Kangas, DO 05/31/18 3:21 PM

## 2018-05-31 NOTE — Discharge Instructions (Signed)
Nonweightbearing to bilateral lower extremities x8 weeks from surgery Unrestricted range of motion to bilateral lower extremities Continue physical therapy, TherEx
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
## 2018-05-31 NOTE — Progress Notes (Signed)
Indwelling Catheter removed without difficulty patient tolerated well. Patient tolerated pain med given at 0047 with Dilaudid 1 mg and Hydrocodone 10 mg for 6 hrs and slept well.
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
## 2018-06-01 ENCOUNTER — Inpatient Hospital Stay (HOSPITAL_COMMUNITY): Payer: Self-pay

## 2018-06-01 MED ORDER — PREGABALIN 100 MG PO CAPS
150.0000 mg | ORAL_CAPSULE | Freq: Two times a day (BID) | ORAL | Status: DC
  Administered 2018-06-01 – 2018-06-03 (×4): 150 mg via ORAL
  Filled 2018-06-01 (×4): qty 1

## 2018-06-01 MED ORDER — HYDROMORPHONE HCL 2 MG PO TABS
2.0000 mg | ORAL_TABLET | Freq: Once | ORAL | Status: AC
  Administered 2018-06-01: 2 mg via ORAL
  Filled 2018-06-01: qty 1

## 2018-06-01 MED ORDER — OXYCODONE HCL ER 20 MG PO T12A
20.0000 mg | EXTENDED_RELEASE_TABLET | Freq: Two times a day (BID) | ORAL | Status: DC
  Administered 2018-06-01 – 2018-06-05 (×8): 20 mg via ORAL
  Filled 2018-06-01 (×8): qty 1

## 2018-06-01 NOTE — Progress Notes (Signed)
Ortho Trauma Note  Patient having a lot of pain after going down for x-rays. Lyrica helping.  X-rays: Stable fixation no changes since last x-rays  A/P APC 3 pelvis s/p ORIF  NWB BLE WBAT BUE Increase Lyrica to 150 mg Follow up with me in 4 weeks for repeat x-rays and possible advancement of WB on R leg  Roby LoftsKevin P. Kobee Medlen, MD [facility] [phone] (phone)
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
## 2018-06-01 NOTE — Progress Notes (Signed)
Patient continues to complain of pain in leg and hip area. (1) dose of Hdromorphone given to patient after calling [facility] on call for Trauma to agree for dose of med.  Patient given another dose now of Hdromorphone after explaining she should not need anymore pain meds with scheduled meds given and now with this even though she has been turning in the bed and sitting up. Informed patient and mother that she will be going for xray in the am of the pelvis area to make sure everything is looking okay and checking the sciatic nerve.  Mother ask about getting forms Signed by MD and referred for today to Shanda BumpsJessica, MSW at [facility] and will follow thru with day RN.
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
## 2018-06-03 MED ORDER — ZOLPIDEM TARTRATE 5 MG PO TABS
5.0000 mg | ORAL_TABLET | Freq: Every evening | ORAL | Status: DC | PRN
  Administered 2018-06-03 – 2018-06-04 (×2): 5 mg via ORAL
  Filled 2018-06-03 (×2): qty 1

## 2018-06-03 MED ORDER — PREGABALIN 100 MG PO CAPS
100.0000 mg | ORAL_CAPSULE | Freq: Three times a day (TID) | ORAL | Status: DC
  Administered 2018-06-03 – 2018-06-04 (×3): 100 mg via ORAL
  Filled 2018-06-03 (×3): qty 1

## 2018-06-03 NOTE — Progress Notes (Signed)
15 Days Post-Op   Subjective/Chief Complaint: Slept better overnight, pain better but using dilaudid, wants more, having bowel function, tol diet   Objective: Vital signs in last 24 hours: Temp:  [98.2 F (36.8 C)-98.7 F (37.1 C)] 98.2 F (36.8 C) (12/07 0850) Pulse Rate:  [94-117] 94 (12/07 0850) Resp:  [16-18] 18 (12/07 0626) BP: (116-127)/(73-83) 117/77 (12/07 0850) SpO2:  [93 %-98 %] 93 % (12/07 0850) Last BM Date: 06/01/18  Intake/Output from previous day: 12/06 0701 - 12/07 0700 In: 240 [P.O.:240] Out: 300 [Urine:300] Intake/Output this shift: No intake/output data recorded.  Gen: Alert, nad Card: RRR Pulm:clear to auscultation bilaterally Abd: Soft, non-tender, non-distended, bowel sounds present MSK: BLE edema, compartments soft, toes WWP, no gross sensory or motor deficits   Studies/Results: No results found.  Anti-infectives: Anti-infectives (From admission, onward)   Start     Dose/Rate Route Frequency Ordered Stop   05/19/18 1630  clindamycin (CLEOCIN) IVPB 900 mg    Note to Pharmacy:  Please give 8 hours from intraoperative dose   900 mg 100 mL/hr over 30 Minutes Intravenous Every 8 hours 05/19/18 1407 05/20/18 0605   05/19/18 1124  vancomycin (VANCOCIN) powder  Status:  Discontinued       As needed 05/19/18 1124 05/19/18 1232   05/19/18 1124  tobramycin (NEBCIN) powder  Status:  Discontinued       As needed 05/19/18 1124 05/19/18 1232   05/17/18 1418  clindamycin (CLEOCIN) 900 MG/50ML IVPB  Status:  Discontinued    Note to Pharmacy:  [name], Amy   : cabinet override      05/17/18 1418 05/17/18 1444   05/17/18 1045  clindamycin (CLEOCIN) IVPB 900 mg     900 mg 100 mL/hr over 30 Minutes Intravenous On call to O.R. 05/17/18 0932 05/17/18 1604      Ass[facility]Plan:  [facility] S/P ex fix by Dr. Jena GaussHaddix, ORIF 11/22. NWB BLE.Transfers only. PT/OT.  B ulnar styloid andLradial styloid FXs- per Dr. Jena GaussHaddix.WBAT through  both wrists Female transitioning to female FEN-regular diet, ensure Foley- d/c 12/4 VTE- Lovenox Follow up: Haddix4weeks Dispo- SNF when PO pain control better, OxyContin increased to 20 mg BID. Needs to be off dilaudid. She is convinced this makes her sleep also.  Will try sleep aid and change lyrica to 100 tid from 150 bid   Current pain regimen:  Tylenol 500 mg q 12h OXYCONTIN 20 mg q 12h  NORCO 10-326 mg q 4 PRN Robaxin 1,000 mg TID Lyrica 100 tid Amy LoronMatthew Uilani Hampton 06/03/2018

## 2018-06-04 MED ORDER — PREGABALIN 50 MG PO CAPS
50.0000 mg | ORAL_CAPSULE | ORAL | Status: AC
  Administered 2018-06-04: 50 mg via ORAL
  Filled 2018-06-04: qty 1

## 2018-06-04 MED ORDER — PREGABALIN 50 MG PO CAPS
150.0000 mg | ORAL_CAPSULE | Freq: Two times a day (BID) | ORAL | Status: DC
  Administered 2018-06-04: 150 mg via ORAL
  Filled 2018-06-04: qty 1

## 2018-06-04 MED ORDER — ACETAMINOPHEN 500 MG PO TABS
1000.0000 mg | ORAL_TABLET | Freq: Three times a day (TID) | ORAL | Status: DC
  Administered 2018-06-04 (×2): 1000 mg via ORAL
  Filled 2018-06-04 (×2): qty 2

## 2018-06-04 NOTE — Progress Notes (Signed)
16 Days Post-Op   Subjective/Chief Complaint: C/o burning/ stabbing pain to LLE   Objective: Vital signs in last 24 hours: Temp:  [97.8 F (36.6 C)-98.8 F (37.1 C)] 97.8 F (36.6 C) (12/08 0622) Pulse Rate:  [91-111] 91 (12/08 0622) Resp:  [16-18] 18 (12/08 0622) BP: (107-123)/(64-83) 107/64 (12/08 0622) SpO2:  [93 %-99 %] 96 % (12/08 0622) Last BM Date: 06/01/18  Intake/Output from previous day: No intake/output data recorded. Intake/Output this shift: No intake/output data recorded.  Gen: Alert, nad Card: RRR Pulm:clear to auscultation bilaterally Abd: Soft, non-tender, non-distended, bowel sounds present MSK: BLE edema, compartments soft, toes WWP, no gross sensory or motor deficits   Studies/Results: No results found.  Anti-infectives: Anti-infectives (From admission, onward)   Start     Dose/Rate Route Frequency Ordered Stop   05/19/18 1630  clindamycin (CLEOCIN) IVPB 900 mg    Note to Pharmacy:  Please give 8 hours from intraoperative dose   900 mg 100 mL/hr over 30 Minutes Intravenous Every 8 hours 05/19/18 1407 05/20/18 0605   05/19/18 1124  vancomycin (VANCOCIN) powder  Status:  Discontinued       As needed 05/19/18 1124 05/19/18 1232   05/19/18 1124  tobramycin (NEBCIN) powder  Status:  Discontinued       As needed 05/19/18 1124 05/19/18 1232   05/17/18 1418  clindamycin (CLEOCIN) 900 MG/50ML IVPB  Status:  Discontinued    Note to Pharmacy:  [name], [name]   : cabinet override      05/17/18 1418 05/17/18 1444   05/17/18 1045  clindamycin (CLEOCIN) IVPB 900 mg     900 mg 100 mL/hr over 30 Minutes Intravenous On call to O.R. 05/17/18 0932 05/17/18 1604      Assessment/Plan:  [facility] book pelvic [facility] FX- S/P ex fix by Dr. Jena GaussHaddix, ORIF 11/22. NWB BLE.Transfers only. PT/OT.  B ulnar styloid andLradial styloid FXs- per Dr. Jena GaussHaddix.WBAT through both wrists Female transitioning to female FEN-regular diet, ensure Foley- d/c 12/4 VTE-  Lovenox Follow up: Haddix4weeks Dispo- SNF when able, OxyContin increased yest to 20 mg BID. Needs to be off dilaudid. She is convinced this makes her sleep also.  Sleep aid added and lyrica schedule changed yesterday- denies noticing a difference. Will increase scheduled tylenol   Current pain regimen:  Tylenol 1000 mg q 8h OXYCONTIN 20 mg q 12h  NORCO 10-326 mg q 4 PRN Robaxin 1,000 mg TID Lyrica 100 tid  Berna Buehelsea A Matin Mattioli 06/04/2018

## 2018-06-05 MED ORDER — ENSURE ENLIVE PO LIQD: 237.0000 mL | Freq: Two times a day (BID) | ORAL | 12 refills | Status: AC

## 2018-06-05 MED ORDER — CALCIUM CITRATE 950 (200 CA) MG PO TABS: 200.0000 mg | ORAL_TABLET | Freq: Two times a day (BID) | ORAL | Status: AC

## 2018-06-05 MED ORDER — ONDANSETRON 4 MG PO TBDP: 4.0000 mg | ORAL_TABLET | Freq: Four times a day (QID) | ORAL | 0 refills | Status: AC | PRN

## 2018-06-05 MED ORDER — ACETAMINOPHEN 500 MG PO TABS
500.0000 mg | ORAL_TABLET | Freq: Three times a day (TID) | ORAL | Status: DC
  Administered 2018-06-05: 500 mg via ORAL
  Filled 2018-06-05: qty 1

## 2018-06-05 MED ORDER — ASCORBIC ACID 500 MG PO TABS: 500.0000 mg | ORAL_TABLET | Freq: Every day | ORAL | Status: AC

## 2018-06-05 MED ORDER — HYDROCODONE-ACETAMINOPHEN 10-325 MG PO TABS: 1.0000 | ORAL_TABLET | ORAL | 0 refills | Status: AC | PRN

## 2018-06-05 MED ORDER — DOCUSATE SODIUM 100 MG PO CAPS: 100.0000 mg | ORAL_CAPSULE | Freq: Two times a day (BID) | ORAL | 0 refills | Status: AC

## 2018-06-05 MED ORDER — PANTOPRAZOLE SODIUM 40 MG PO TBEC: 40.0000 mg | DELAYED_RELEASE_TABLET | Freq: Every day | ORAL | Status: AC

## 2018-06-05 MED ORDER — ZOLPIDEM TARTRATE 5 MG PO TABS: 5.0000 mg | ORAL_TABLET | Freq: Every evening | ORAL | 0 refills | Status: AC | PRN

## 2018-06-05 MED ORDER — DIPHENHYDRAMINE HCL 12.5 MG/5ML PO ELIX: 12.5000 mg | ORAL_SOLUTION | Freq: Four times a day (QID) | ORAL | 0 refills | Status: AC | PRN

## 2018-06-05 MED ORDER — VITAMIN D3 25 MCG (1000 UNIT) PO TABS: 2000.0000 [IU] | ORAL_TABLET | Freq: Every day | ORAL | Status: AC

## 2018-06-05 MED ORDER — ENOXAPARIN SODIUM 40 MG/0.4ML ~~LOC~~ SOLN: 40.0000 mg | SUBCUTANEOUS | Status: AC

## 2018-06-05 MED ORDER — METHOCARBAMOL 500 MG PO TABS: 1000.0000 mg | ORAL_TABLET | Freq: Three times a day (TID) | ORAL | 0 refills | Status: AC

## 2018-06-05 MED ORDER — OXYCODONE HCL ER 20 MG PO T12A: 20.0000 mg | EXTENDED_RELEASE_TABLET | Freq: Two times a day (BID) | ORAL | 0 refills | Status: AC

## 2018-06-05 MED ORDER — PREGABALIN 200 MG PO CAPS: 200.0000 mg | ORAL_CAPSULE | Freq: Two times a day (BID) | ORAL | 0 refills | Status: AC

## 2018-06-05 MED ORDER — VITAMIN D (ERGOCALCIFEROL) 1.25 MG (50000 UNIT) PO CAPS: 50000.0000 [IU] | ORAL_CAPSULE | ORAL | Status: AC

## 2018-06-05 MED ORDER — PREGABALIN 100 MG PO CAPS
200.0000 mg | ORAL_CAPSULE | Freq: Two times a day (BID) | ORAL | Status: DC
  Administered 2018-06-05: 200 mg via ORAL
  Filled 2018-06-05: qty 2

## 2018-06-05 MED ORDER — POLYETHYLENE GLYCOL 3350 17 G PO PACK: 17.0000 g | PACK | Freq: Every day | ORAL | 0 refills | Status: AC | PRN

## 2018-06-05 MED ORDER — ACETAMINOPHEN 500 MG PO TABS: 500.0000 mg | ORAL_TABLET | Freq: Three times a day (TID) | ORAL | 0 refills | Status: AC
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
## 2018-06-05 NOTE — Clinical Social Work Note (Signed)
Clinical Social Worker facilitated patient discharge including contacting patient family and facility to confirm patient discharge plans.  Clinical information faxed to facility and family agreeable with plan.  CS[facility]  RN to call report prior to discharge.  Clinical Social Worker will sign off for now as social work intervention is no longer needed. Please consult us again if new need arises.  Macario GoldsJesse Brynne Doane, Kentucky[place]CS[place] [phone]

## 2018-06-05 NOTE — Clinical Social Work Placement (Signed)
   CLINICAL SOCIAL WORK PLACEMENT  NOTE  Date:  06/05/2018  Patient Details  Name: Amy Hampton MRN: [id number] Date of Birth: 08/13/1985  Clinical Social Work is seeking post-discharge placement for this patient at the Skilled  Nursing Facility level of care (*CSW will initial, date and re-position this form in  chart as items are completed):  Yes   Patient/family provided with [place] Clinical Social Work Department's list of facilities offering this level of care within the geographic area requested by the patient (or if unable, by the patient's family).  Yes   Patient/family informed of their freedom to choose among providers that offer the needed level of care, that participate in Medicare, Medicaid or managed care program needed by the patient, have an available bed and are willing to accept the patient.  Yes   Patient/family informed of [facility]'s ownership interest in [facility] and [facility], as well as of the fact that they are under no obligation to receive care at these facilities.  PASRR submitted to EDS on 05/29/18     PASRR number received on 05/29/18     Existing PASRR number confirmed on       FL2 transmitted to all facilities in geographic area requested by pt/family on 05/24/18     FL2 transmitted to all facilities within larger geographic area on       Patient informed that his/her managed care company has contracts with or will negotiate with certain facilities, including the following:        Yes   Patient/family informed of bed offers received.  [facility]     Physician recommends and patient chooses bed at      [facility].  Patient to be transferred to facility by Ambulance     Patient family notified on 06/05/18 of transfer.  Name of family member notified:  Patient mother at bedside     PHYSICIAN Please prepare priority  discharge summary, including medications     Additional Comment:   Macario GoldsJesse Monroe Qin, LCSW [phone]

## 2018-06-05 NOTE — Progress Notes (Signed)
OT Cancellation Note  Patient Details Name: Amy Hampton MRN: [id number] DOB: 07/30/1985   Cancelled Treatment:    Reason Eval/Treat Not Completed: Patient declined, no reason specified. Attempting twice for treatment. Pt declining both times reporting that her pain was so bad she wouldn't be able to participate. Pt having received muscle relaxer, pain medication, and Adderall ~10:20. Pt planning to dc to SNF today. Will return as schedule allows.   Takisha Pelle M [name] [name] MSOT, OTR/L Acute Rehab Pager: [phone] Office: [phone] 06/05/2018, 11:05 AM

## 2018-06-05 NOTE — Progress Notes (Signed)
PT Cancellation Note  Patient Details Name: Amy Lodgeiffany Hampton MRN: [id number] DOB: 06/14/1986   Cancelled Treatment:    Reason Eval/Treat Not Completed: Patient declined, no reason specified;Pain limiting ability to participate. Attempted to see pt at 1031 pt requested to return later to allow all meds to kick in. Upon return at 1100 she continued to refuse despite all premedication.    Enedina FinnerMaija B Ermin Parisien 06/05/2018, 11:04 AM  Delaney MeigsMaija Tabor Ralene Gasparyan, PT Acute Rehabilitation Services Pager: [phone] Office: [phone]

## 2018-06-05 NOTE — Progress Notes (Signed)
Report called to nurse Joni ReiningNicole [facility]. Pt will be going there this afternoon to room 101, and will be transported by [facility]. Await their arrival. Pt and her mother are aware of plans for transport as set up by SW.

## 2018-06-06 ENCOUNTER — Encounter: Payer: Self-pay | Admitting: Family Medicine

## 2018-06-26 ENCOUNTER — Ambulatory Visit: Payer: Self-pay | Admitting: Family Medicine

## 2018-07-31 ENCOUNTER — Inpatient Hospital Stay: Payer: Self-pay | Admitting: Family Medicine

## 2018-08-14 ENCOUNTER — Telehealth: Payer: Self-pay | Admitting: Family Medicine

## 2018-08-14 NOTE — Telephone Encounter (Signed)
Called pt. Re: request for narcotic pain medication.  Advised she will need to be seen in office before any pain medication can be prescribed.  Recommended she contact the Orthopedic office for refill.  Has appt. on 08/30/18 with Dr. Neva Seat; checked for earlier appt. with Dr. Neva Seat and Dr. Leretha Pol, but no avail. Openings prior to 08/30/18.  Pt. Stated she will contact the Orthopedic office re: refill of pain medication.

## 2018-08-14 NOTE — Telephone Encounter (Signed)
Copied from CRM [id number]. Topic: Quick Communication - Rx Refill/Question >> Aug 14, 2018  3:55 PM Richarda Blade wrote: Medication: HYDROcodone-acetaminophen (NORCO) 10-325 MG tablet   oxyCODONE (OXYCONTIN) 20 mg 12 hr tablet [[id number]]   Has the patient contacted their pharmacy? No. (Agent: If no, request that the patient contact the pharmacy for the refill.) Patient is out of refills and is in pain. Has appointment on 3/4 but has no more meds (Agent: If yes, when and what did the pharmacy advise?)  Preferred Pharmacy (with phone number or street name): [id number] - [place], [place] - [address] [facility] [address] [phone] (Phone) [phone] (Fax)    Agent: Please be advised that RX refills may take up to 3 business days. We ask that you follow-up with your pharmacy.

## 2018-08-30 ENCOUNTER — Ambulatory Visit: Payer: Self-pay | Admitting: Family Medicine

## 2019-08-31 IMAGING — CR DG PELVIS 3+V JUDET
5 series · 5 of 5 positions shown · non-contrast
Comparison: CT 05/20/2018

CLINICAL DATA: Postop internal fixation of pelvic fractures

EXAM:
JUDET PELVIS - 3+ VIEW

[pelvis ap (1 of 3)]
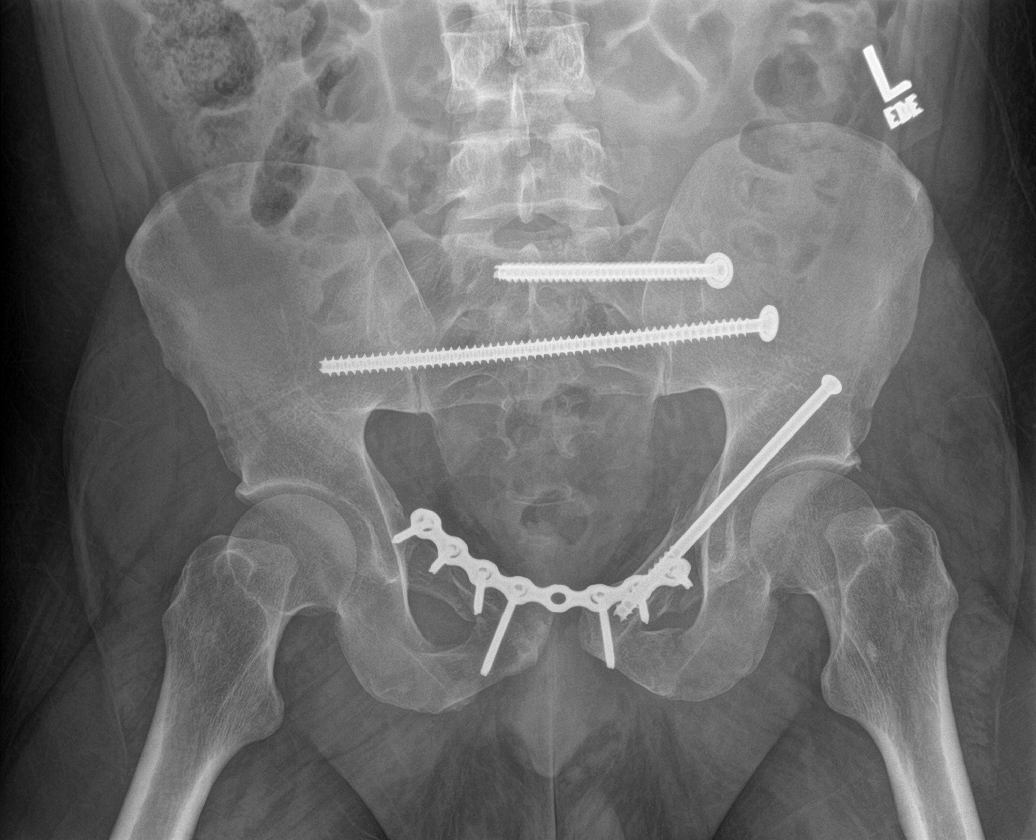

[pelvis obl (1 of 2)]
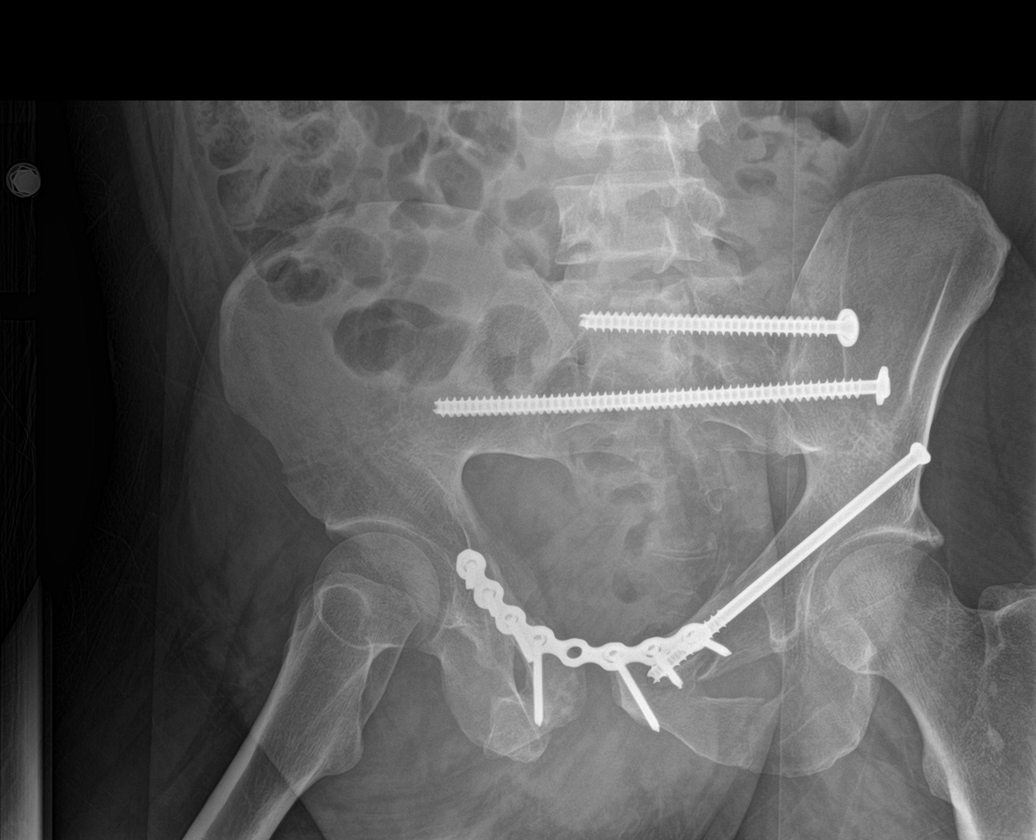

[pelvis obl (2 of 2)]
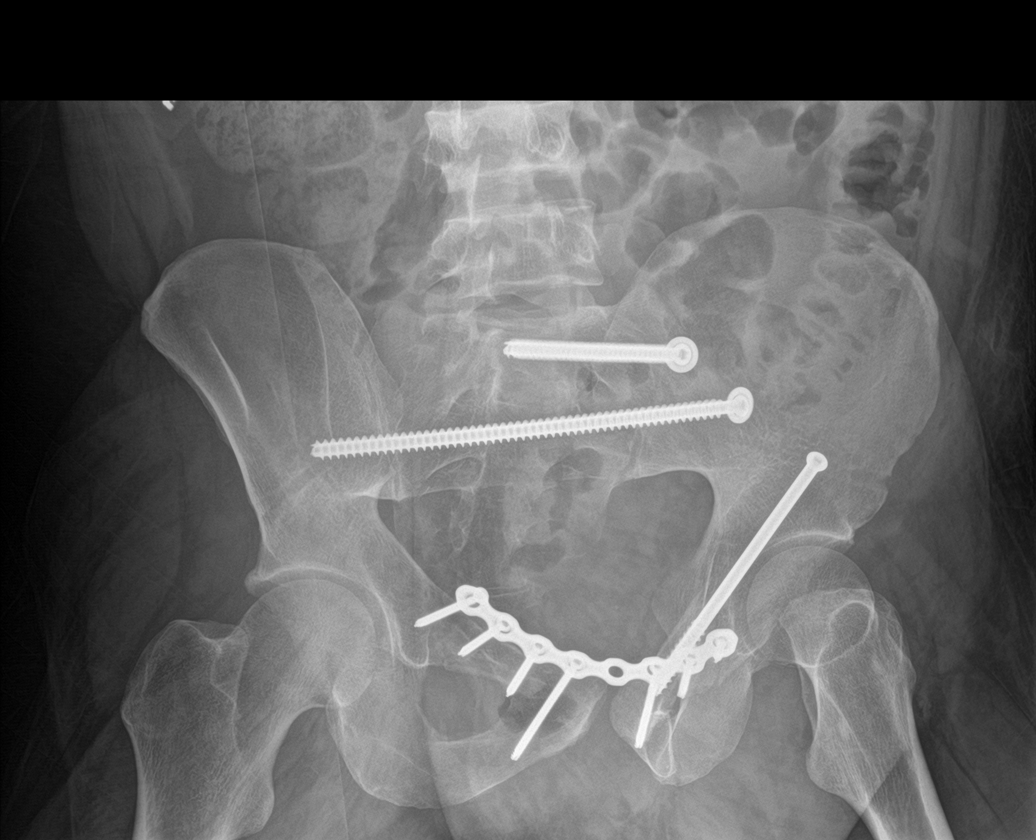

[pelvis ap (2 of 3)]
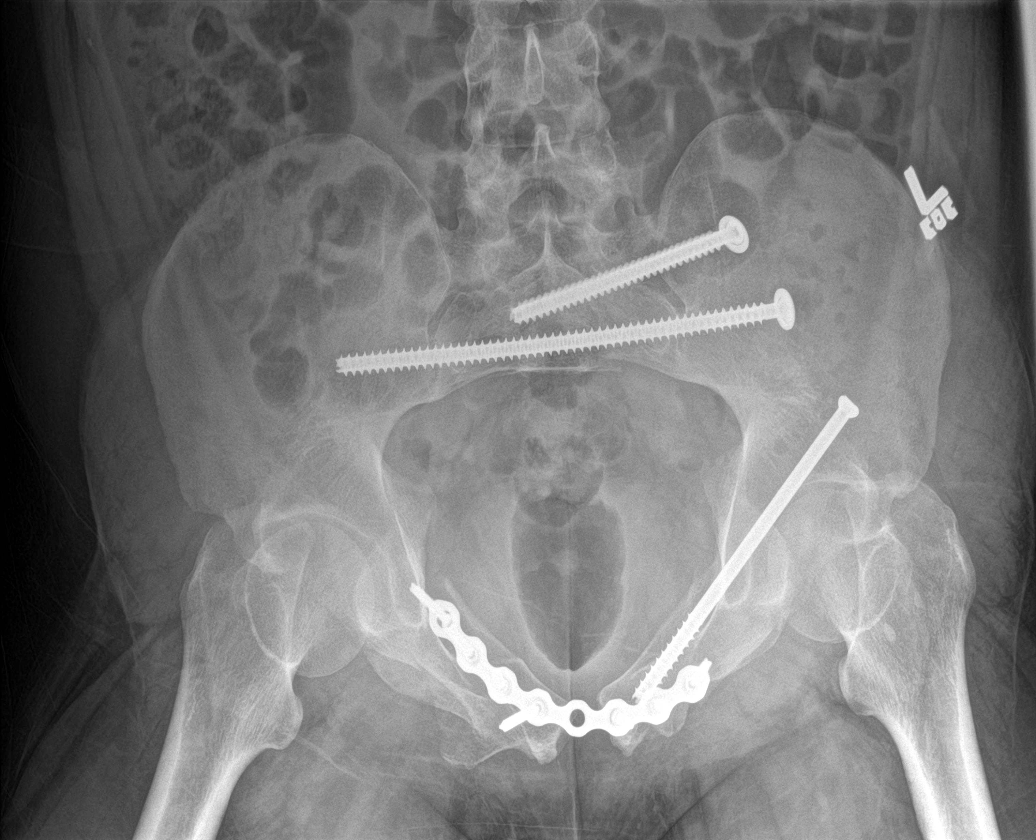

[pelvis ap (3 of 3)]
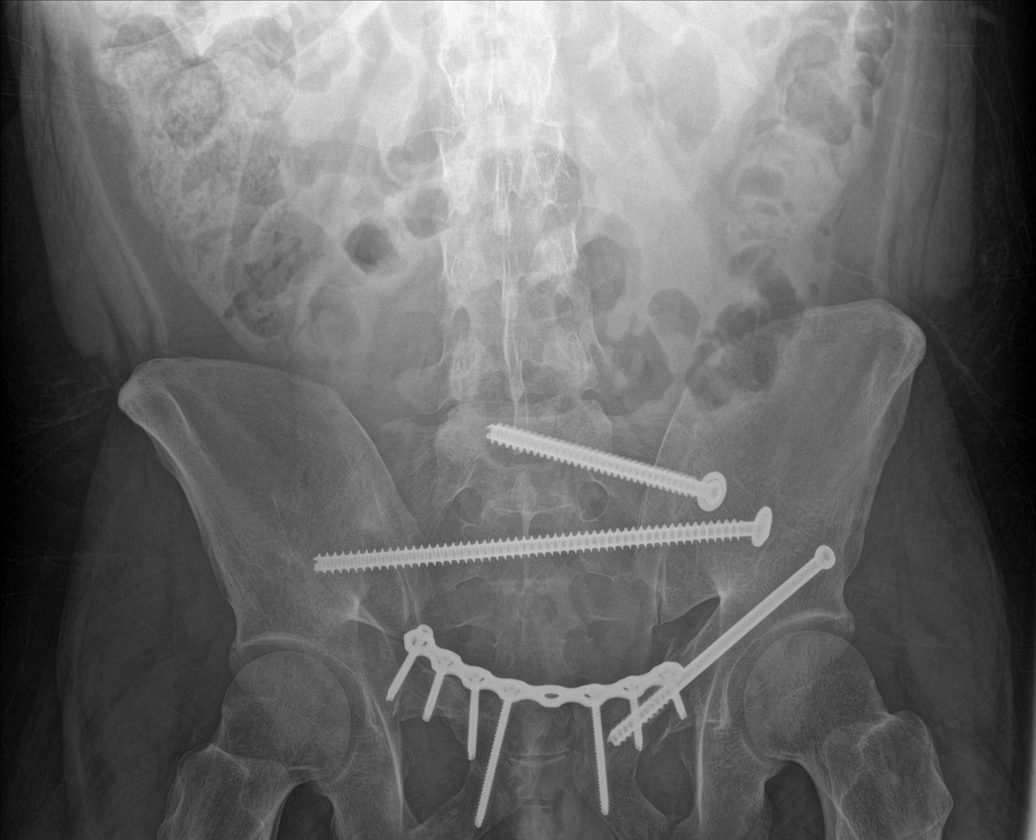

[5 of 5 positions shown; findings below may reference images not displayed]

FINDINGS: 2 cannulated screws span the sacrum and medial iliac bones from a
LEFT approach.

Dynamic fixation plate spans the pubic symphysis with improvement in
the diastasis. 10 mm diastasis remains. Cannulated screw extends
along the iliopectineal line of the LEFT ischium.
IMPRESSION: Internal fixation of complex sacral and pelvic fractures as above.
